# Patient Record
Sex: Female | Born: 1960 | Race: White | Hispanic: No | Marital: Married | Smoking: Never smoker
Health system: Southern US, Community
[De-identification: ages and names within clinical notes are randomized; demographics above are authoritative.]

## PROBLEM LIST (undated history)

## (undated) DIAGNOSIS — F418 Other specified anxiety disorders: Secondary | ICD-10-CM

## (undated) DIAGNOSIS — E785 Hyperlipidemia, unspecified: Secondary | ICD-10-CM

## (undated) HISTORY — PX: CHEST TUBE INSERTION: SHX231

## (undated) HISTORY — PX: ABDOMINAL HYSTERECTOMY: SHX81

---

## 2004-06-13 ENCOUNTER — Ambulatory Visit: Payer: Self-pay | Admitting: Obstetrics and Gynecology

## 2005-07-01 ENCOUNTER — Ambulatory Visit: Payer: Self-pay | Admitting: Obstetrics and Gynecology

## 2006-07-22 ENCOUNTER — Ambulatory Visit: Payer: Self-pay | Admitting: Obstetrics and Gynecology

## 2007-10-08 ENCOUNTER — Ambulatory Visit: Payer: Self-pay | Admitting: Obstetrics and Gynecology

## 2008-09-05 ENCOUNTER — Ambulatory Visit: Payer: Self-pay | Admitting: Obstetrics and Gynecology

## 2008-09-11 ENCOUNTER — Ambulatory Visit: Payer: Self-pay | Admitting: Obstetrics and Gynecology

## 2009-08-31 ENCOUNTER — Ambulatory Visit: Payer: Self-pay | Admitting: Obstetrics and Gynecology

## 2009-10-10 ENCOUNTER — Emergency Department: Payer: Self-pay | Admitting: Emergency Medicine

## 2010-09-12 ENCOUNTER — Ambulatory Visit: Payer: Self-pay | Admitting: Obstetrics and Gynecology

## 2011-09-15 ENCOUNTER — Ambulatory Visit: Payer: Self-pay | Admitting: Obstetrics and Gynecology

## 2012-10-04 ENCOUNTER — Ambulatory Visit: Payer: Self-pay | Admitting: Obstetrics and Gynecology

## 2013-10-18 ENCOUNTER — Ambulatory Visit: Payer: Self-pay | Admitting: Obstetrics and Gynecology

## 2014-08-08 ENCOUNTER — Encounter: Payer: Self-pay | Admitting: General Surgery

## 2014-08-23 ENCOUNTER — Ambulatory Visit: Payer: Self-pay | Admitting: General Surgery

## 2014-08-24 ENCOUNTER — Encounter: Payer: Self-pay | Admitting: *Deleted

## 2014-10-20 ENCOUNTER — Ambulatory Visit: Payer: Self-pay | Admitting: Obstetrics and Gynecology

## 2015-03-28 ENCOUNTER — Other Ambulatory Visit
Admission: RE | Admit: 2015-03-28 | Discharge: 2015-03-28 | Disposition: A | Discharge status: Home / Self Care | Payer: PRIVATE HEALTH INSURANCE | Source: Ambulatory Visit | Attending: Ophthalmology | Admitting: Ophthalmology

## 2015-03-28 DIAGNOSIS — H04301 Unspecified dacryocystitis of right lacrimal passage: Secondary | ICD-10-CM | POA: Insufficient documentation

## 2015-03-31 LAB — EYE CULTURE: Culture: NO GROWTH

## 2015-08-08 ENCOUNTER — Encounter: Payer: Self-pay | Admitting: Physician Assistant

## 2015-08-08 ENCOUNTER — Ambulatory Visit: Payer: Self-pay | Admitting: Physician Assistant

## 2015-08-08 VITALS — BP 119/60 | HR 95 | Temp 98.5°F

## 2015-08-08 DIAGNOSIS — J069 Acute upper respiratory infection, unspecified: Secondary | ICD-10-CM

## 2015-08-08 MED ORDER — AMOXICILLIN 875 MG PO TABS: 875.0000 mg | ORAL_TABLET | Freq: Two times a day (BID) | ORAL | Status: DC

## 2015-08-08 MED ORDER — BENZONATATE 200 MG PO CAPS: 200.0000 mg | ORAL_CAPSULE | Freq: Three times a day (TID) | ORAL | Status: DC | PRN

## 2015-08-08 NOTE — Progress Notes (Signed)
S: C/o runny nose and congestion for 2 days, no fever, +chills, no cp/sob, v/d; mucus was green this am but clear throughout the day, cough is sporadic,   O: PE: vitals wnl, nad,  perrl eomi, normocephalic, tms dull, nasal mucosa red and swollen, throat injected, neck supple no lymph, lungs c t a, cv rrr, neuro intact  A:  Acute uri   P: amoxil 875mg  bid x 10d, tessalon perls; drink fluids, continue regular meds , use otc meds of choice, return if not improving in 5 days, return earlier if worsening

## 2015-08-15 ENCOUNTER — Other Ambulatory Visit: Payer: Self-pay | Admitting: Obstetrics and Gynecology

## 2015-08-15 DIAGNOSIS — Z1231 Encounter for screening mammogram for malignant neoplasm of breast: Secondary | ICD-10-CM

## 2015-08-22 ENCOUNTER — Encounter: Payer: Self-pay | Admitting: Physician Assistant

## 2015-08-22 ENCOUNTER — Ambulatory Visit: Payer: Self-pay | Admitting: Physician Assistant

## 2015-08-22 VITALS — BP 100/70 | HR 87 | Temp 97.9°F

## 2015-08-22 DIAGNOSIS — J018 Other acute sinusitis: Secondary | ICD-10-CM

## 2015-08-22 MED ORDER — FLUCONAZOLE 150 MG PO TABS: 150.0000 mg | ORAL_TABLET | Freq: Once | ORAL | Status: DC

## 2015-08-22 MED ORDER — CEFDINIR 300 MG PO CAPS: 300.0000 mg | ORAL_CAPSULE | Freq: Two times a day (BID) | ORAL | Status: DC

## 2015-08-22 NOTE — Progress Notes (Signed)
S: continue cough and congestion, no fever/chills, no cp/sob, mucus is still green  O: vitals wnl, nad, tms dull, nasal mucosa red and swollen, throat wnl, neck supple no lymph, lungs c t a, cv rrr  A: continued sinusitis  P: omnicef, diflucan

## 2015-10-12 ENCOUNTER — Other Ambulatory Visit: Payer: Self-pay

## 2015-10-12 DIAGNOSIS — Z0000 Encounter for general adult medical examination without abnormal findings: Secondary | ICD-10-CM

## 2015-10-12 NOTE — Progress Notes (Signed)
Patient came in to have blood drawn per Dr. Dalbert Garnet at [facility].  Blood was drawn from the left arm without any incident.

## 2015-10-13 LAB — SPECIMEN STATUS

## 2015-10-17 LAB — COMPREHENSIVE METABOLIC PANEL
A/G RATIO: 2.3 (ref 1.1–2.5)
ALBUMIN: 4.4 g/dL (ref 3.5–5.5)
ALT: 15 IU/L (ref 0–32)
AST: 16 IU/L (ref 0–40)
Alkaline Phosphatase: 72 IU/L (ref 39–117)
BILIRUBIN TOTAL: 0.3 mg/dL (ref 0.0–1.2)
BUN / CREAT RATIO: 13 (ref 9–23)
BUN: 10 mg/dL (ref 6–24)
CALCIUM: 9.5 mg/dL (ref 8.7–10.2)
CHLORIDE: 103 mmol/L (ref 96–106)
CO2: 25 mmol/L (ref 18–29)
Creatinine, Ser: 0.78 mg/dL (ref 0.57–1.00)
GFR, EST AFRICAN AMERICAN: 101 mL/min/{1.73_m2} (ref >59)
GFR, EST NON AFRICAN AMERICAN: 88 mL/min/{1.73_m2} (ref >59)
Globulin, Total: 1.9 g/dL (ref 1.5–4.5)
Glucose: 87 mg/dL (ref 65–99)
POTASSIUM: 4.1 mmol/L (ref 3.5–5.2)
Sodium: 142 mmol/L (ref 134–144)
TOTAL PROTEIN: 6.3 g/dL (ref 6.0–8.5)

## 2015-10-17 LAB — LIPID PANEL
CHOLESTEROL TOTAL: 193 mg/dL (ref 100–199)
Chol/HDL Ratio: 2.9 ratio units (ref 0.0–4.4)
HDL: 67 mg/dL (ref >39)
LDL Calculated: 111 mg/dL — ABNORMAL HIGH (ref 0–99)
TRIGLYCERIDES: 73 mg/dL (ref 0–149)
VLDL Cholesterol Cal: 15 mg/dL (ref 5–40)

## 2015-10-17 LAB — SPECIMEN STATUS REPORT

## 2015-10-18 NOTE — Progress Notes (Signed)
Lab results were sent to Dr. Dalbert Garnet at [facility] and a copy was sent to the patient per her request.

## 2015-10-22 ENCOUNTER — Ambulatory Visit
Admission: RE | Admit: 2015-10-22 | Discharge: 2015-10-22 | Disposition: A | Discharge status: Home / Self Care | Payer: Managed Care, Other (non HMO) | Source: Ambulatory Visit | Attending: Obstetrics and Gynecology | Admitting: Obstetrics and Gynecology

## 2015-10-22 DIAGNOSIS — Z1231 Encounter for screening mammogram for malignant neoplasm of breast: Secondary | ICD-10-CM | POA: Diagnosis present

## 2015-11-29 ENCOUNTER — Other Ambulatory Visit: Payer: Self-pay | Admitting: Physician Assistant

## 2015-11-29 NOTE — Telephone Encounter (Signed)
Med refill approved 

## 2016-09-11 ENCOUNTER — Other Ambulatory Visit: Payer: Self-pay | Admitting: Obstetrics and Gynecology

## 2016-09-11 DIAGNOSIS — Z1231 Encounter for screening mammogram for malignant neoplasm of breast: Secondary | ICD-10-CM

## 2016-09-22 ENCOUNTER — Other Ambulatory Visit: Payer: Self-pay

## 2016-09-22 DIAGNOSIS — Z299 Encounter for prophylactic measures, unspecified: Secondary | ICD-10-CM

## 2016-09-22 NOTE — Progress Notes (Signed)
Patient came in to have blood drawn for testing per [facility]ethany [facility]

## 2016-09-23 LAB — CMP12+LP+TP+TSH+6AC+CBC/D/PLT
A/G RATIO: 1.9 (ref 1.2–2.2)
ALBUMIN: 4 g/dL (ref 3.5–5.5)
ALT: 15 IU/L (ref 0–32)
AST: 14 IU/L (ref 0–40)
Alkaline Phosphatase: 57 IU/L (ref 39–117)
BASOS ABS: 0 10*3/uL (ref 0.0–0.2)
BUN/Creatinine Ratio: 21 (ref 9–23)
BUN: 16 mg/dL (ref 6–24)
Basos: 0 %
CHOLESTEROL TOTAL: 195 mg/dL (ref 100–199)
CREATININE: 0.76 mg/dL (ref 0.57–1.00)
Calcium: 9 mg/dL (ref 8.7–10.2)
Chloride: 104 mmol/L (ref 96–106)
Chol/HDL Ratio: 2.7 ratio units (ref 0.0–4.4)
EOS (ABSOLUTE): 0.1 10*3/uL (ref 0.0–0.4)
Eos: 1 %
Estimated CHD Risk: <0.5 times avg. (ref 0.0–1.0)
FREE THYROXINE INDEX: 1.8 (ref 1.2–4.9)
GFR calc Af Amer: 104 mL/min/{1.73_m2} (ref >59)
GFR, EST NON AFRICAN AMERICAN: 90 mL/min/{1.73_m2} (ref >59)
GGT: 5 IU/L (ref 0–60)
GLOBULIN, TOTAL: 2.1 g/dL (ref 1.5–4.5)
GLUCOSE: 87 mg/dL (ref 65–99)
HDL: 73 mg/dL (ref >39)
Hematocrit: 37.3 % (ref 34.0–46.6)
Hemoglobin: 12.5 g/dL (ref 11.1–15.9)
IRON: 84 ug/dL (ref 27–159)
Immature Grans (Abs): 0 10*3/uL (ref 0.0–0.1)
Immature Granulocytes: 0 %
LDH: 172 IU/L (ref 119–226)
LDL Calculated: 107 mg/dL — ABNORMAL HIGH (ref 0–99)
LYMPHS ABS: 1.6 10*3/uL (ref 0.7–3.1)
Lymphs: 31 %
MCH: 32.9 pg (ref 26.6–33.0)
MCHC: 33.5 g/dL (ref 31.5–35.7)
MCV: 98 fL — ABNORMAL HIGH (ref 79–97)
MONOCYTES: 6 %
Monocytes Absolute: 0.3 10*3/uL (ref 0.1–0.9)
NEUTROS PCT: 62 %
Neutrophils Absolute: 3.1 10*3/uL (ref 1.4–7.0)
PHOSPHORUS: 3.7 mg/dL (ref 2.5–4.5)
PLATELETS: 222 10*3/uL (ref 150–379)
Potassium: 4.5 mmol/L (ref 3.5–5.2)
RBC: 3.8 x10E6/uL (ref 3.77–5.28)
RDW: 12.9 % (ref 12.3–15.4)
Sodium: 143 mmol/L (ref 134–144)
T3 Uptake Ratio: 21 % — ABNORMAL LOW (ref 24–39)
T4 TOTAL: 8.6 ug/dL (ref 4.5–12.0)
TOTAL PROTEIN: 6.1 g/dL (ref 6.0–8.5)
TSH: 1.84 u[IU]/mL (ref 0.450–4.500)
Triglycerides: 75 mg/dL (ref 0–149)
URIC ACID: 3.4 mg/dL (ref 2.5–7.1)
VLDL Cholesterol Cal: 15 mg/dL (ref 5–40)
WBC: 5.1 10*3/uL (ref 3.4–10.8)

## 2016-10-28 ENCOUNTER — Ambulatory Visit: Payer: PRIVATE HEALTH INSURANCE

## 2016-12-09 ENCOUNTER — Ambulatory Visit
Admission: RE | Admit: 2016-12-09 | Discharge: 2016-12-09 | Disposition: A | Discharge status: Home / Self Care | Payer: Managed Care, Other (non HMO) | Source: Ambulatory Visit | Attending: Obstetrics and Gynecology | Admitting: Obstetrics and Gynecology

## 2016-12-09 DIAGNOSIS — Z1231 Encounter for screening mammogram for malignant neoplasm of breast: Secondary | ICD-10-CM | POA: Diagnosis not present

## 2016-12-26 ENCOUNTER — Other Ambulatory Visit: Payer: Self-pay | Admitting: Physician Assistant

## 2016-12-29 NOTE — Telephone Encounter (Signed)
Med refill for flonase approved 

## 2017-07-06 ENCOUNTER — Encounter: Payer: Self-pay | Admitting: Emergency Medicine

## 2017-07-06 ENCOUNTER — Other Ambulatory Visit: Payer: Self-pay

## 2017-07-06 ENCOUNTER — Emergency Department
Admission: EM | Admit: 2017-07-06 | Discharge: 2017-07-06 | Disposition: A | Discharge status: Home / Self Care | Payer: Managed Care, Other (non HMO) | Attending: Student in an Organized Health Care Education/Training Program | Admitting: Student in an Organized Health Care Education/Training Program

## 2017-07-06 DIAGNOSIS — Z79899 Other long term (current) drug therapy: Secondary | ICD-10-CM | POA: Insufficient documentation

## 2017-07-06 DIAGNOSIS — M62830 Muscle spasm of back: Secondary | ICD-10-CM | POA: Insufficient documentation

## 2017-07-06 DIAGNOSIS — M549 Dorsalgia, unspecified: Secondary | ICD-10-CM | POA: Diagnosis present

## 2017-07-06 MED ORDER — NAPROXEN 500 MG PO TABS: 500.0000 mg | ORAL_TABLET | Freq: Two times a day (BID) | ORAL | 0 refills | Status: DC

## 2017-07-06 MED ORDER — METHOCARBAMOL 500 MG PO TABS
1000.0000 mg | ORAL_TABLET | Freq: Once | ORAL | Status: AC
  Administered 2017-07-06: 1000 mg via ORAL
  Filled 2017-07-06: qty 2

## 2017-07-06 MED ORDER — METHOCARBAMOL 500 MG PO TABS: 500.0000 mg | ORAL_TABLET | Freq: Four times a day (QID) | ORAL | 0 refills | Status: DC

## 2017-07-06 MED ORDER — CYCLOBENZAPRINE HCL 10 MG PO TABS: 10.0000 mg | ORAL_TABLET | Freq: Once | ORAL | Status: DC

## 2017-07-06 MED ORDER — KETOROLAC TROMETHAMINE 30 MG/ML IJ SOLN
30.0000 mg | Freq: Once | INTRAMUSCULAR | Status: AC
  Administered 2017-07-06: 30 mg via INTRAMUSCULAR
  Filled 2017-07-06: qty 1

## 2017-07-06 MED ORDER — OXYCODONE-ACETAMINOPHEN 5-325 MG PO TABS: 1.0000 | ORAL_TABLET | Freq: Four times a day (QID) | ORAL | 0 refills | Status: DC | PRN

## 2017-07-06 MED ORDER — TRAMADOL HCL 50 MG PO TABS: 50.0000 mg | ORAL_TABLET | Freq: Once | ORAL | Status: DC

## 2017-07-06 MED ORDER — OXYCODONE-ACETAMINOPHEN 5-325 MG PO TABS
1.0000 | ORAL_TABLET | Freq: Once | ORAL | Status: AC
  Administered 2017-07-06: 1 via ORAL
  Filled 2017-07-06: qty 1

## 2017-07-06 NOTE — ED Provider Notes (Signed)
[facility] Emergency Department Provider Note   ____________________________________________   First MD Initiated Contact with Patient 07/06/17 1227     (approximate)  I have reviewed the triage vital signs and the nursing notes.   HISTORY  Chief Complaint Back Pain   HPI Lisa Mccullough is a 56 y.o. female is here complaining of back pain with spasms. Patient states he has history of back pain in the past but no surgeries. She states that today she was turning at work when she felt "a catch". Since that time she has continued to have muscle spasms and has been very uncomfortable. A coworker is with patient who had to drive her here. Patient has not taken any over-the-counter medication prior to arrival. She denies any paresthesias, incontinence of bowel or bladder. Currently she rates her pain as 10 over 10.   History reviewed. No pertinent past medical history.  There are no active problems to display for this patient.   Past Surgical History:  Procedure Laterality Date  . ABDOMINAL HYSTERECTOMY      Prior to Admission medications   Medication Sig Start Date End Date Taking? Authorizing Provider  buPROPion (BUDEPRION XL) 300 MG 24 hr tablet Take by mouth. 08/08/14   [provider]  Cholecalciferol (D 1000) 1000 UNITS capsule Take by mouth.    [provider]  estrogens, conjugated, (PREMARIN) 0.625 MG tablet TAKE 1 TABLET (0.625 MG TOTAL) BY MOUTH ONCE DAILY. 04/02/15   [provider]  fluconazole (DIFLUCAN) 150 MG tablet Take 1 tablet (150 mg total) by mouth once. 08/22/15   Sherrie MustacheFisher, Roselyn BeringSusan W, PA-C  fluticasone [facility](FLONASE) 50 MCG/ACT nasal spray [facility] NO[facility]IL ONCE DAILY 12/29/16   Sherrie MustacheFisher, Roselyn BeringSusan W, PA-C  methocarbamol (ROBAXIN) 500 MG tablet Take 1 tablet (500 mg total) 4 (four) times daily by mouth. 07/06/17   Tommi RumpsSummers, Rhonda L, PA-C  naproxen (NAPROSYN) 500 MG tablet Take 1 tablet (500 mg total) 2 (two) times  daily with a meal by mouth. 07/06/17   Tommi RumpsSummers, Rhonda L, PA-C  oxyCODONE-acetaminophen (PERCOCET) 5-325 MG tablet Take 1 tablet every 6 (six) hours as needed by mouth for severe pain. 07/06/17   Tommi RumpsSummers, Rhonda L, PA-C    Allergies Codeine sulfate  Family History  Problem Relation Age of Onset  . Breast cancer Neg Hx     Social History Social History   Tobacco Use  . Smoking status: Never Smoker  . Smokeless tobacco: Never Used  Substance Use Topics  . Alcohol use: No    Alcohol/week: 0.0 oz    Frequency: Never  . Drug use: No    Review of Systems Constitutional: No fever/chills Cardiovascular: Denies chest pain. Respiratory: Denies shortness of breath. Gastrointestinal: No abdominal pain.  No nausea, no vomiting. Genitourinary: Negative for dysuria. Musculoskeletal: positive for back pain. Skin: Negative for rash. Neurological: Negative for headaches, focal weakness or numbness. ____________________________________________   PHYSICAL EXAM:  VITAL SIGNS: ED Triage Vitals  Enc Vitals Group     BP 07/06/17 1214 (!) 143/61     Pulse Rate 07/06/17 1214 81     Resp 07/06/17 1214 18     Temp 07/06/17 1214 98.6 F (37 C)     Temp Source 07/06/17 1214 Oral     SpO2 07/06/17 1214 100 %     Weight 07/06/17 1215 155 lb (70.3 kg)     Height 07/06/17 1215 5\' 2"  (1.575 m)     Head Circumference --  Peak Flow --      Pain Score 07/06/17 1214 10     Pain Loc --      Pain Edu? --      Excl. in GC? --     Constitutional: Alert and oriented. Well appearing and in no acute distress. Eyes: Conjunctivae are normal.  Head: Atraumatic. Nose: No congestion/rhinnorhea. Neck: No stridor.   Cardiovascular: Normal rate, regular rhythm. Grossly normal heart sounds.  Good peripheral circulation. Respiratory: Normal respiratory effort.  No retractions. Lungs CTAB. Gastrointestinal: Soft and nontender. No distention.  Musculoskeletal: examination back there is no gross  deformity however there is marked tenderness on palpation of the thoracic and lumbar spine and paravertebral muscles bilaterally. Patient is guarding. Reflexes are equal bilaterally. Straight leg raises are difficult secondary to patient's pain. Neurologic:  Normal speech and language. No gross focal neurologic deficits are appreciated.  Skin:  Skin is warm, dry and intact. No ecchymosis, abrasions or erythema was noted. Psychiatric: Mood and affect are normal. Speech and behavior are normal.  ____________________________________________   LABS (all labs ordered are listed, but only abnormal results are displayed)  Labs Reviewed - No data to display RADIOLOGY  No results found.  ____________________________________________   PROCEDURES  Procedure(s) performed: None  Procedures  Critical Care performed: No  ____________________________________________   INITIAL IMPRESSION / ASSESSMENT AND PLAN / ED COURSE Patient was given Toradol 30 mg IM along with Robaxin and Percocet. Patient did get relief with this and was able to move without severe muscle spasms. Patient's coworker is to drive her home. Patient was encouraged to use ice or heat to her back as needed while she arrives at her home. She is aware that she cannot drive while taking this medication. Patient was discharged on Percocet 5 mg, Robaxin, and naproxen. She is to follow-up with her PCP if any continued problems. Patient was also given a work note for the next 2 days.  ____________________________________________   FINAL CLINICAL IMPRESSION(S) / ED DIAGNOSES  Final diagnoses:  Muscle spasm of back     ED Discharge Orders        Ordered    oxyCODONE-acetaminophen (PERCOCET) 5-325 MG tablet  Every 6 hours PRN     07/06/17 1405    methocarbamol (ROBAXIN) 500 MG tablet  4 times daily     07/06/17 1405    naproxen (NAPROSYN) 500 MG tablet  2 times daily with meals     07/06/17 1405       Note:  This document  was prepared using Dragon voice recognition software and may include unintentional dictation errors.    Tommi RumpsSummers, Rhonda L, PA-C 07/06/17 1529    Willy Eddyobinson, Patrick, MD 07/06/17 [phone]

## 2017-07-06 NOTE — Discharge Instructions (Signed)
Follow up with your primary care provider if any continued problems. Moist heat or ice to your back as needed for comfort. Continue taking medication only as directed. Robaxin 500 mg 4 times a day as needed for muscle spasms, naproxen 500 mg twice a day with food for inflammation. Percocet one every 6 hours as needed for severe pain. Do not drive all taking Robaxin or Percocet as his medications can cause drowsiness.

## 2017-07-06 NOTE — ED Notes (Signed)
Pt ambulatory to wheel chair upon discharge. Pt husband drove her home d/t medications administered during stay in ED. Verbalized understanding of discharge instructions, follow-up care and prescriptions. VSS. A&O x4. Skin warm and dry.

## 2017-07-06 NOTE — ED Triage Notes (Signed)
States back spasms and pain, states hx of back pain and was at work today when she "tweeked" something

## 2017-09-17 ENCOUNTER — Other Ambulatory Visit: Payer: Self-pay | Admitting: Obstetrics and Gynecology

## 2017-09-17 DIAGNOSIS — Z1231 Encounter for screening mammogram for malignant neoplasm of breast: Secondary | ICD-10-CM

## 2017-10-22 ENCOUNTER — Ambulatory Visit: Payer: Self-pay

## 2017-12-10 ENCOUNTER — Ambulatory Visit
Admission: RE | Admit: 2017-12-10 | Discharge: 2017-12-10 | Disposition: A | Discharge status: Home / Self Care | Payer: Managed Care, Other (non HMO) | Source: Ambulatory Visit | Attending: Obstetrics and Gynecology | Admitting: Obstetrics and Gynecology

## 2017-12-10 DIAGNOSIS — Z1231 Encounter for screening mammogram for malignant neoplasm of breast: Secondary | ICD-10-CM | POA: Insufficient documentation

## 2017-12-14 ENCOUNTER — Other Ambulatory Visit: Payer: Self-pay | Admitting: Family Medicine

## 2017-12-14 DIAGNOSIS — Z20828 Contact with and (suspected) exposure to other viral communicable diseases: Secondary | ICD-10-CM

## 2017-12-14 MED ORDER — OSELTAMIVIR PHOSPHATE 75 MG PO CAPS: 75.0000 mg | ORAL_CAPSULE | Freq: Every day | ORAL | 0 refills | Status: AC

## 2017-12-14 NOTE — Progress Notes (Signed)
Patient called to say that multiple household members were just diagnosed with the flu and was requesting a prescription for Tamiflu. Patient is asymptomatic. Confirmed that patient is not allergic to this and that she is only currently taking premarin, wellbutrin, and zyrtec.  Advised patient to be counseled in full by the pharmacist regarding safety/adverse/side/warning of this medication.

## 2018-01-25 ENCOUNTER — Ambulatory Visit: Payer: Self-pay | Admitting: Family Medicine

## 2018-01-25 VITALS — BP 99/58 | Ht 63.0 in | Wt 177.0 lb

## 2018-01-25 DIAGNOSIS — Z008 Encounter for other general examination: Secondary | ICD-10-CM

## 2018-01-25 DIAGNOSIS — Z0189 Encounter for other specified special examinations: Principal | ICD-10-CM

## 2018-01-25 NOTE — Progress Notes (Signed)
Subjective: Annual biometrics screening labs Patient presents for her annual biometric screening labs only.  Patient has had her annual physical exam with Dr. Dalbert GarnetBeasley.  Patient reports that her blood pressure is usually in the 90s over 50s.  Patient reports this is normal for her and she is asymptomatic.  Denies any symptoms or concerns today.  Assessment Annual biometrics screening labs   Plan  Lipid panel and fasting blood sugar pending. Encouraged routine visits with primary care provider.

## 2018-01-26 LAB — LIPID PANEL
CHOL/HDL RATIO: 3.2 ratio (ref 0.0–4.4)
Cholesterol, Total: 205 mg/dL — ABNORMAL HIGH (ref 100–199)
HDL: 65 mg/dL (ref >39)
LDL Calculated: 121 mg/dL — ABNORMAL HIGH (ref 0–99)
TRIGLYCERIDES: 95 mg/dL (ref 0–149)
VLDL Cholesterol Cal: 19 mg/dL (ref 5–40)

## 2018-01-26 LAB — GLUCOSE, RANDOM: GLUCOSE: 92 mg/dL (ref 65–99)

## 2018-01-26 NOTE — Progress Notes (Signed)
Carollee HerterShannon, Will you call the patient and inform them that their lipid panel and fasting blood sugar came back?  Everything is normal, with the exception of her total cholesterol and LDL cholesterol.  The total cholesterol is elevated at 205, normal values are between 100 and 199.  Last year her total cholesterol was 195.  The LDL cholesterol ("bad cholesterol") is elevated at 121, normal values are below 99.  Last year her LDL cholesterol was 107.  Please advise the patient to follow-up with their primary care provider regarding these results.

## 2018-09-03 ENCOUNTER — Ambulatory Visit: Payer: Managed Care, Other (non HMO) | Admitting: Podiatry

## 2018-09-14 ENCOUNTER — Ambulatory Visit (INDEPENDENT_AMBULATORY_CARE_PROVIDER_SITE_OTHER): Payer: Managed Care, Other (non HMO)

## 2018-09-14 ENCOUNTER — Encounter: Payer: Self-pay | Admitting: Podiatry

## 2018-09-14 ENCOUNTER — Ambulatory Visit: Payer: Managed Care, Other (non HMO) | Admitting: Podiatry

## 2018-09-14 VITALS — BP 100/64 | HR 76

## 2018-09-14 DIAGNOSIS — M779 Enthesopathy, unspecified: Secondary | ICD-10-CM | POA: Diagnosis not present

## 2018-09-14 DIAGNOSIS — S93324A Dislocation of tarsometatarsal joint of right foot, initial encounter: Secondary | ICD-10-CM | POA: Diagnosis not present

## 2018-09-14 MED ORDER — DICLOFENAC SODIUM 75 MG PO TBEC: 75.0000 mg | DELAYED_RELEASE_TABLET | Freq: Two times a day (BID) | ORAL | 1 refills | Status: DC

## 2018-09-15 ENCOUNTER — Telehealth: Payer: Self-pay | Admitting: *Deleted

## 2018-09-25 ENCOUNTER — Ambulatory Visit
Admission: RE | Admit: 2018-09-25 | Discharge: 2018-09-25 | Disposition: A | Discharge status: Home / Self Care | Payer: Managed Care, Other (non HMO) | Source: Ambulatory Visit | Attending: Podiatry | Admitting: Podiatry

## 2018-09-25 DIAGNOSIS — S93324A Dislocation of tarsometatarsal joint of right foot, initial encounter: Secondary | ICD-10-CM | POA: Insufficient documentation

## 2018-09-26 NOTE — Progress Notes (Signed)
   HPI: 58 year old female presents the office today for evaluation regarding right foot pain.  Patient has significant amount of pain when walking on her right foot.  She also notices it rolls at times.  When stepping she also hears an audible pop.  Patient states that she sustained a slip and fall injury in November and landed with her entire body weight on top of her foot.  The foot fell back behind her when she fell on top of it.  Patient has had chronic pain to the right foot ever since the injury.  This is been ongoing for the past 3-4 months now.  Patient is constant nagging and throbbing pain despite conservative modalities.  She presents for treatment and evaluation  History reviewed. No pertinent past medical history.   Physical Exam: General: The patient is alert and oriented x3 in no acute distress.  Dermatology: Skin is warm, dry and supple bilateral lower extremities. Negative for open lesions or macerations.  Vascular: Palpable pedal pulses bilaterally. No edema or erythema noted. Capillary refill within normal limits.  Neurological: Epicritic and protective threshold grossly intact bilaterally.   Musculoskeletal Exam: Range of motion within normal limits to all pedal and ankle joints bilateral. Muscle strength 5/5 in all groups bilateral.  Significant amount of pain on palpation noted to the Lisfranc joint between the medial cuneiform and second metatarsal base.  Radiographic Exam:  Normal osseous mineralization. Joint spaces preserved. No fracture/dislocation/boney destruction.  There does appear to be some joint space widening of the Lisfranc joint between the medial cuneiform and second metatarsal.  This correlates to the significant amount of pain on palpation to the Lisfranc joint.  No osseous fracture identified however.  Assessment: 1.  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 Patient evaluated. X-Rays reviewed.  2.  Prescription  for diclofenac 2 times daily 3.  Today we placed the patient in an immobilization cam boot.  Weightbearing as tolerated to the right foot 4.  Today we are going to order an MRI right foot to rule out Lisfranc ligament rupture.  Patient may require ORIF with screw fixation to the Lisfranc ligament if there is findings of a Lisfranc injury. 5.  Return to clinic in 3 weeks to review MRI results      Felecia ShellingBrent M. Pennelope Basque, DPM [facility]  Dr. Felecia ShellingBrent M. Arsen Mangione, DPM    [address]                                        [place], [place] [address]                Office [phone]  Fax [phone]

## 2018-09-27 ENCOUNTER — Other Ambulatory Visit: Payer: Self-pay

## 2018-09-27 ENCOUNTER — Telehealth: Payer: Self-pay

## 2018-09-27 MED ORDER — NAPROXEN 500 MG PO TABS: 500.0000 mg | ORAL_TABLET | Freq: Two times a day (BID) | ORAL | 0 refills | Status: DC

## 2018-09-27 NOTE — Telephone Encounter (Signed)
Meloxicam or Naprosyn 500mg  BID  Thanks, Dr. Logan BoresEvans

## 2018-09-27 NOTE — Telephone Encounter (Signed)
Dr. Norlene Campbell called stating that medication Dr. Logan Bores prescribed her at her last appointment "Diclofenac Sodium" has given her headaches and would like to know if he can prescribe something different. Please call her to let her know once medication has been changed and sent over to pharmacy.   Please advise

## 2018-09-27 NOTE — Progress Notes (Signed)
Rx Naproxen 500mg  x2 daily sent into pharmacy on file

## 2018-09-27 NOTE — Telephone Encounter (Signed)
Completed - Called pt. Spoke and Talked to pt about medication problem with diclofenac sodium; pt stated, "medication is giving me headaches, wanted to try other pain medication";  Informed pt of Dr. Logan Bores suggesstion of Naproxen x2 daily; comfirmed pharmacy and sent

## 2018-10-08 ENCOUNTER — Ambulatory Visit: Payer: Managed Care, Other (non HMO) | Admitting: Podiatry

## 2018-10-08 ENCOUNTER — Encounter: Payer: Self-pay | Admitting: Podiatry

## 2018-10-08 DIAGNOSIS — M21611 Bunion of right foot: Secondary | ICD-10-CM | POA: Diagnosis not present

## 2018-10-08 DIAGNOSIS — M76821 Posterior tibial tendinitis, right leg: Secondary | ICD-10-CM | POA: Diagnosis not present

## 2018-10-08 MED ORDER — IBUPROFEN 800 MG PO TABS: 800.0000 mg | ORAL_TABLET | Freq: Three times a day (TID) | ORAL | 1 refills | Status: DC | PRN

## 2018-10-08 MED ORDER — METHYLPREDNISOLONE 4 MG PO TBPK: ORAL_TABLET | ORAL | 0 refills | Status: DC

## 2018-10-11 NOTE — Progress Notes (Signed)
    HPI: 58 year old female presenting today for follow up evaluation of right foot pain. She reports the pain has improved but not completely resolved. She has been using the CAM boot which helps alleviate the symptoms. She denies worsening factors. She is here to review her MRI results. Patient is here for further evaluation and treatment.  No past medical history on file.    Physical Exam: General: The patient is alert and oriented x3 in no acute distress.  Dermatology: Skin is warm, dry and supple bilateral lower extremities. Negative for open lesions or macerations.  Vascular: Palpable pedal pulses bilaterally. No edema or erythema noted. Capillary refill within normal limits.  Neurological: Epicritic and protective threshold grossly intact bilaterally.   Musculoskeletal Exam: Pain on palpation noted to the insertion of the posterior tibial tendon of the right foot. Clinical evidence of bunion deformity noted to the respective foot. There is moderate pain on palpation range of motion of the first MPJ. Lateral deviation of the hallux noted consistent with hallux abductovalgus. Range of motion within normal limits. Muscle strength 5/5 in all muscle groups bilateral lower extremities.  MRI Impression:  1. Bifid medial sesamoid of the first digit with very low-grade edema suggesting low-grade sesamoiditis. 2. Postoperative findings of the fifth metatarsal. Unfused secondary ossification center versus remote nondisplaced but nonunited avulsion fracture of the base of fifth metatarsal. 3. No significant subcutaneous edema is identified. No extensor tendon abnormality of the great toe is noted. 4. Mild degenerative spurring along the Lisfranc joint, but the Lisfranc ligament is intact and the Lisfranc joint appears otherwise unremarkable. 5. There is some loss of the normal arch due to large of the foot suggested on the sagittal images, can not exclude pes planus although today's images were not  obtained under weight-bearing.  Assessment: 1. 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 Insertional posterior tibial tendinitis right 3. HAV w/ bunion deformity right   Plan of Care:  1. Patient was evaluated. MRI was reviewed today. 2. Injection of 0.5 mL Celestone Soluspan injected into the insertion of the right posterior tibial tendon sheath.  3. Prescription for Medrol Dose Pak provided to patient. 4. Prescription for Motrin 800 mg provided to patient.  5. Discontinue using CAM boot. Recommended good shoe gear.  6. Bunion pad dispensed. Surgical vs conservative treatments were discussed. We will continue conservative treatment at this time.  7. Return to clinic in 4 weeks.    Felecia Shelling, DPM [facility]  Dr. Felecia Shelling, DPM    [address]                                        [place], [place] [address]                Office [phone]  Fax [phone]

## 2018-10-29 ENCOUNTER — Ambulatory Visit: Payer: Managed Care, Other (non HMO) | Admitting: Podiatry

## 2019-01-24 ENCOUNTER — Other Ambulatory Visit: Payer: Self-pay | Admitting: Obstetrics and Gynecology

## 2019-01-24 DIAGNOSIS — Z1231 Encounter for screening mammogram for malignant neoplasm of breast: Secondary | ICD-10-CM

## 2019-03-07 ENCOUNTER — Other Ambulatory Visit: Payer: Self-pay

## 2019-03-07 ENCOUNTER — Ambulatory Visit
Admission: RE | Admit: 2019-03-07 | Discharge: 2019-03-07 | Disposition: A | Discharge status: Home / Self Care | Payer: Managed Care, Other (non HMO) | Source: Ambulatory Visit | Attending: Obstetrics and Gynecology | Admitting: Obstetrics and Gynecology

## 2019-03-07 DIAGNOSIS — Z1231 Encounter for screening mammogram for malignant neoplasm of breast: Secondary | ICD-10-CM | POA: Diagnosis present

## 2019-05-10 ENCOUNTER — Other Ambulatory Visit: Payer: Self-pay

## 2019-05-10 MED ORDER — IBUPROFEN 800 MG PO TABS: 800.0000 mg | ORAL_TABLET | Freq: Three times a day (TID) | ORAL | 3 refills | Status: DC | PRN

## 2019-05-10 NOTE — Telephone Encounter (Signed)
Pharmacy refill request for Ibuprofen 800mg   Per Dr. Evans verbal order, ok to refill.   Script has been sent to pharmacy 

## 2020-01-12 ENCOUNTER — Other Ambulatory Visit: Payer: Self-pay | Admitting: Internal Medicine

## 2020-01-12 DIAGNOSIS — Z1231 Encounter for screening mammogram for malignant neoplasm of breast: Secondary | ICD-10-CM

## 2020-02-02 ENCOUNTER — Other Ambulatory Visit: Payer: Self-pay | Admitting: Obstetrics and Gynecology

## 2020-02-02 DIAGNOSIS — Z1231 Encounter for screening mammogram for malignant neoplasm of breast: Secondary | ICD-10-CM

## 2020-03-08 ENCOUNTER — Ambulatory Visit
Admission: RE | Admit: 2020-03-08 | Discharge: 2020-03-08 | Disposition: A | Discharge status: Home / Self Care | Payer: Managed Care, Other (non HMO) | Source: Ambulatory Visit | Attending: Obstetrics and Gynecology | Admitting: Obstetrics and Gynecology

## 2020-03-08 DIAGNOSIS — Z1231 Encounter for screening mammogram for malignant neoplasm of breast: Secondary | ICD-10-CM | POA: Diagnosis present

## 2021-03-20 ENCOUNTER — Other Ambulatory Visit: Payer: Self-pay | Admitting: Internal Medicine

## 2021-03-20 DIAGNOSIS — Z1231 Encounter for screening mammogram for malignant neoplasm of breast: Secondary | ICD-10-CM

## 2021-03-27 ENCOUNTER — Other Ambulatory Visit: Payer: Self-pay

## 2021-03-27 ENCOUNTER — Ambulatory Visit
Admission: RE | Admit: 2021-03-27 | Discharge: 2021-03-27 | Disposition: A | Discharge status: Home / Self Care | Payer: Managed Care, Other (non HMO) | Source: Ambulatory Visit | Attending: Internal Medicine | Admitting: Internal Medicine

## 2021-03-27 DIAGNOSIS — Z1231 Encounter for screening mammogram for malignant neoplasm of breast: Secondary | ICD-10-CM | POA: Insufficient documentation

## 2021-12-23 IMAGING — MG MM DIGITAL SCREENING BILAT W/ TOMO AND CAD
8 series · 8 of 24 positions shown · non-contrast
Comparison: Previous exam(s).

CLINICAL DATA: Screening.

EXAM:
DIGITAL SCREENING BILATERAL MAMMOGRAM WITH TOMOSYNTHESIS AND CAD
TECHNIQUE: Bilateral screening digital craniocaudal and mediolateral oblique
mammograms were obtained. Bilateral screening digital breast
tomosynthesis was performed. The images were evaluated with
computer-aided detection.

[R CC synth-2D]
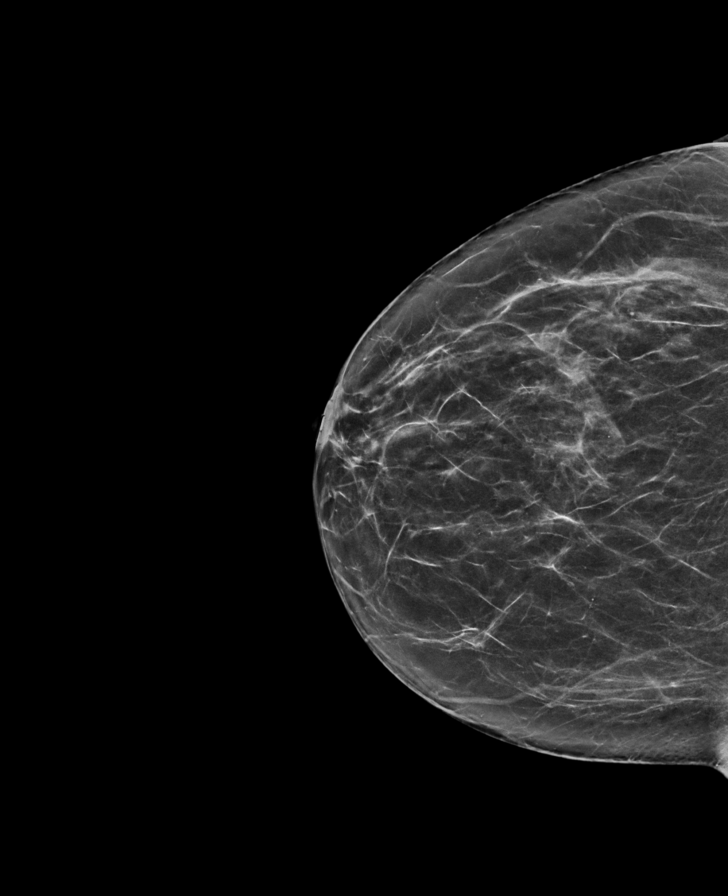

[L MLO synth-2D]
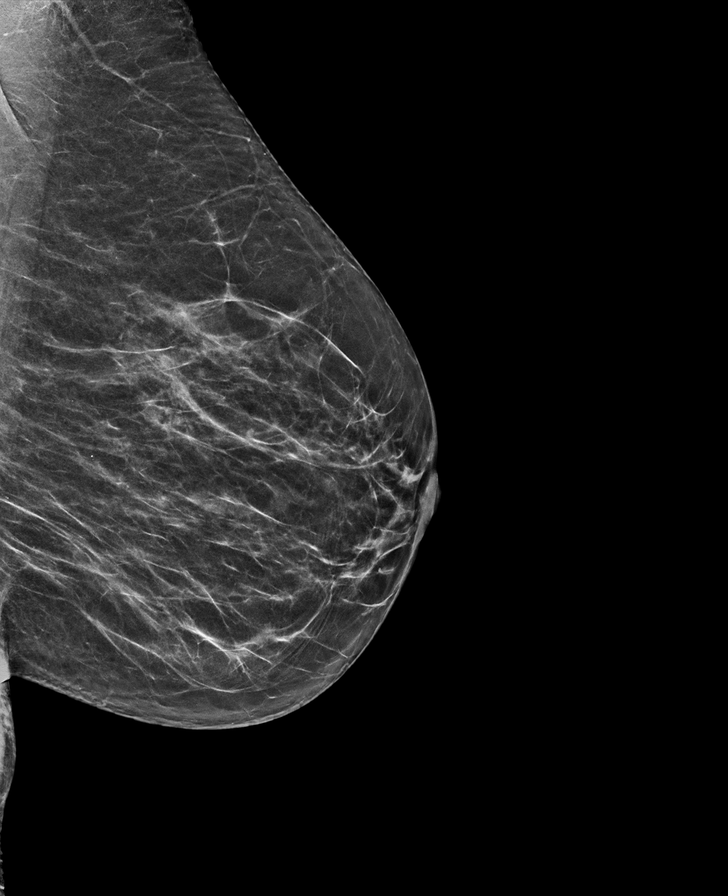

[R MLO synth-2D]
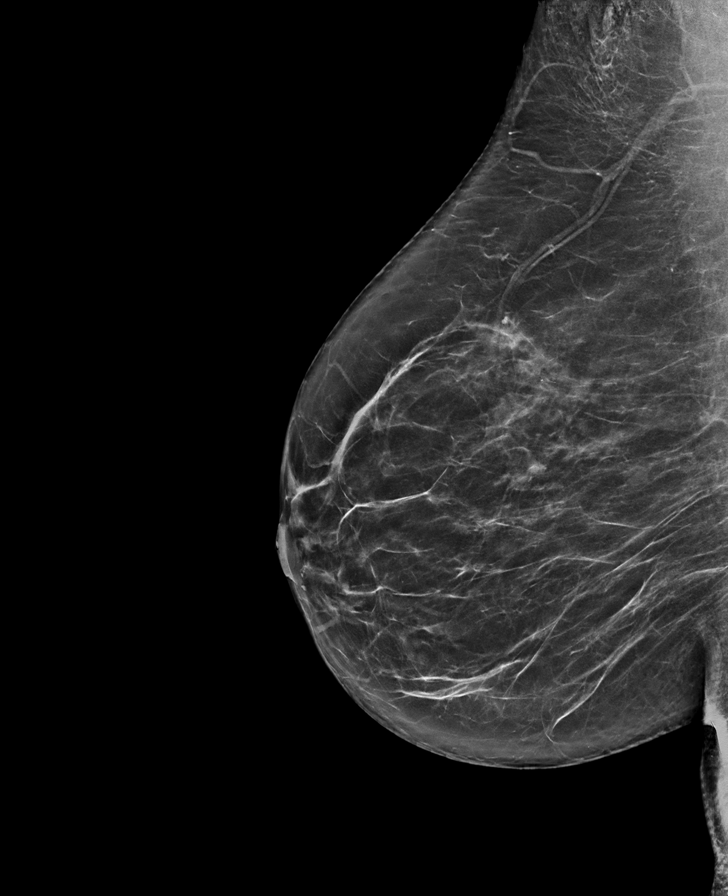

[L CC synth-2D]
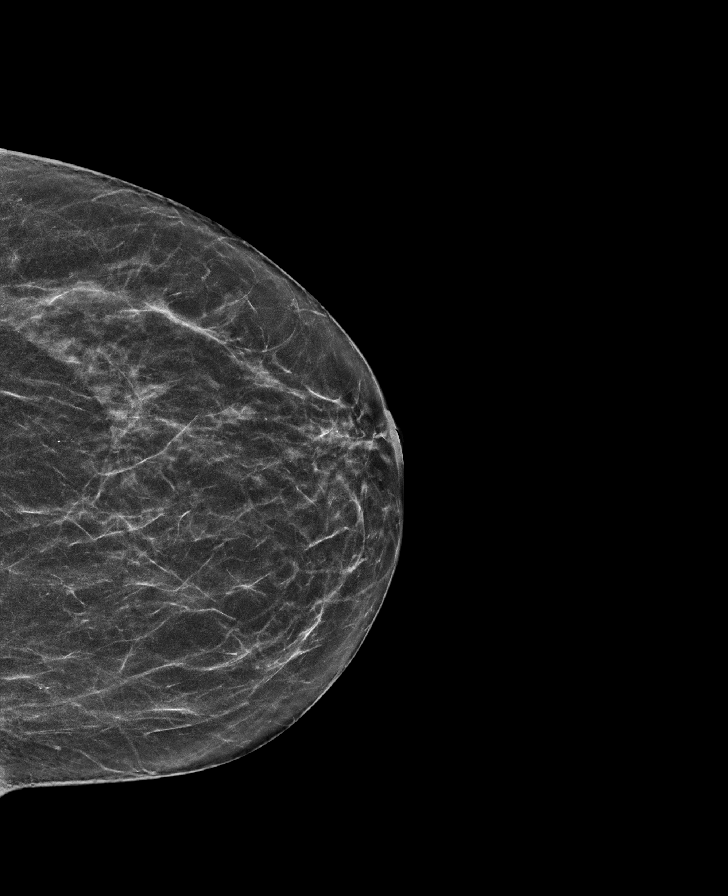

[R CC tomo · tomo slice 35/70.0]
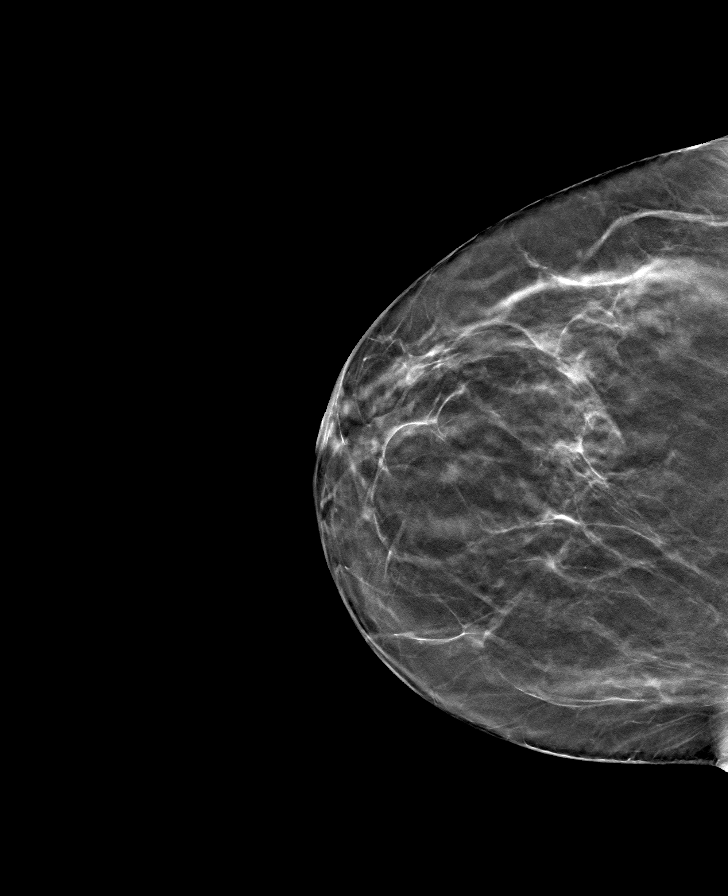

[L MLO tomo · tomo slice 31/60.0]
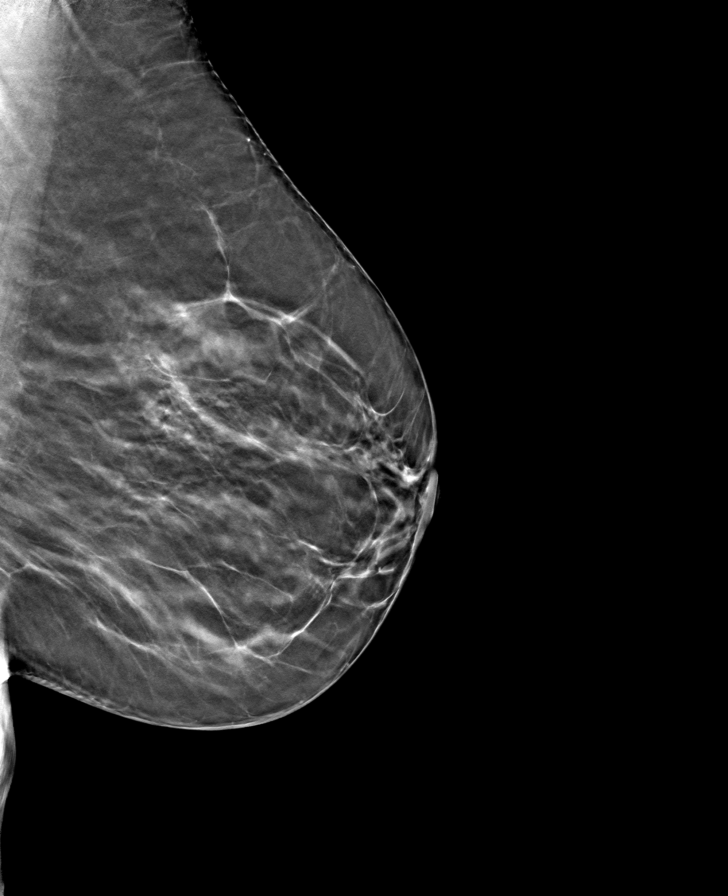

[L CC tomo · tomo slice 30/59.0]
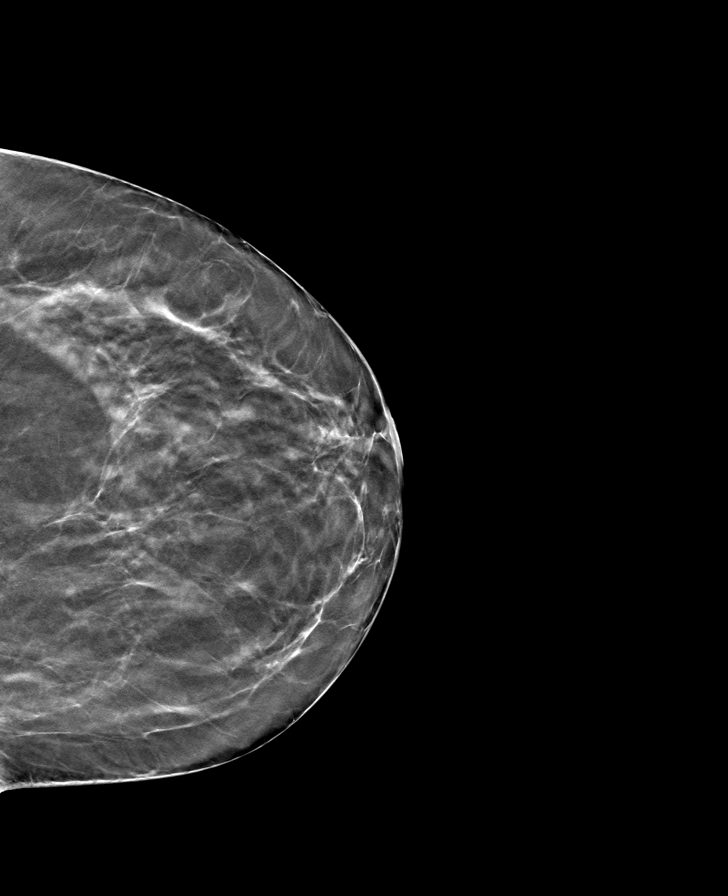

[R MLO tomo · tomo slice 36/71.0]
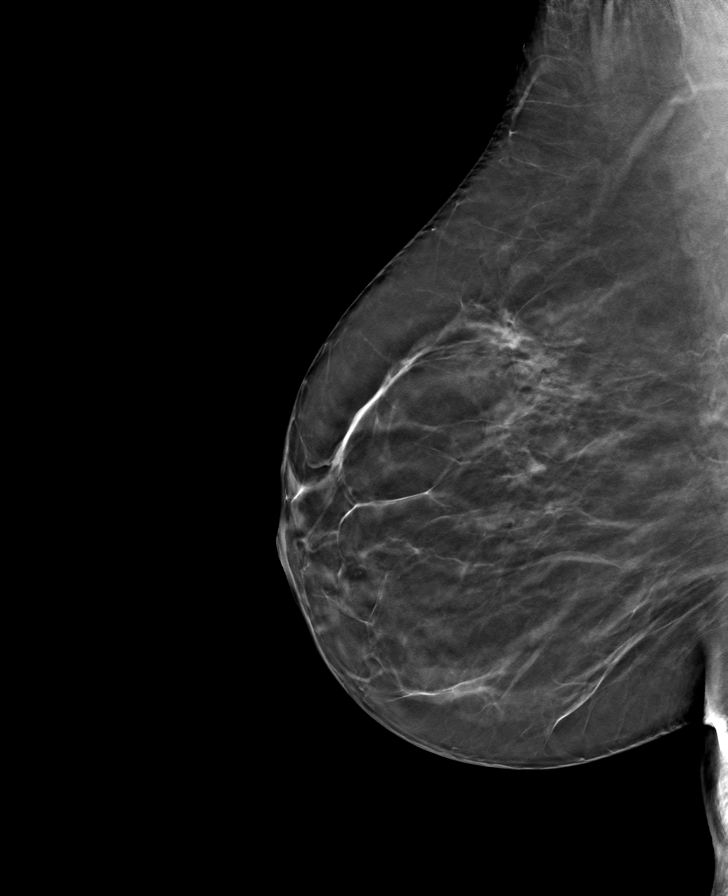

[8 of 24 positions shown; findings below may reference images not displayed]

ACR Breast Density Category b: There are scattered areas of
fibroglandular density.
FINDINGS: There are no findings suspicious for malignancy.
IMPRESSION: No mammographic evidence of malignancy. A result letter of this
screening mammogram will be mailed directly to the patient.

RECOMMENDATION:
Screening mammogram in one year. (Code:51-O-LD2)

BI-RADS CATEGORY  1: Negative.

## 2022-04-16 ENCOUNTER — Other Ambulatory Visit: Payer: Self-pay | Admitting: Internal Medicine

## 2022-04-16 DIAGNOSIS — Z1231 Encounter for screening mammogram for malignant neoplasm of breast: Secondary | ICD-10-CM

## 2022-05-20 ENCOUNTER — Ambulatory Visit
Admission: RE | Admit: 2022-05-20 | Discharge: 2022-05-20 | Disposition: A | Discharge status: Home / Self Care | Payer: Managed Care, Other (non HMO) | Source: Ambulatory Visit | Attending: Internal Medicine | Admitting: Internal Medicine

## 2022-05-20 DIAGNOSIS — Z1231 Encounter for screening mammogram for malignant neoplasm of breast: Secondary | ICD-10-CM | POA: Diagnosis not present

## 2023-04-21 NOTE — Progress Notes (Signed)
 Lisa Mccullough is a  62 y.o. female who presents for  CHIEF COMPLAINT Chief Complaint  Patient presents with  . Annual Exam    Subjective: History of Present Illness  Pt in NAD. Here for yearly eval, Followed by GYN. Has HLD not on statin and anxiety on meds. Has lost 10 pounds from last year. Feels OK. Labs OK except anemia. Now with left hip and thigh pain x 6 mo. Sleeping OK. No fever or HA's. Denies Cp or SOB. No palpitations. No change in bowels or bladder. Due for MMG and colon.    Past Medical History:  Diagnosis Date  . Allergy   . Anxiety   . Hyperlipidemia    Patient Active Problem List  Diagnosis  . Menopausal state  . Pure hypercholesterolemia  . GAD (generalized anxiety disorder)    Past Surgical History:  Procedure Laterality Date  . Foot Surgery Right 1996  . HYSTERECTOMY  09/11/2008   TVH - Adenomyosis, leiomyomata  . DCR Right 06/12/2015   Tear Duct     Current Outpatient Medications:  .  buPROPion  (WELLBUTRIN  XL) 300 MG XL tablet, Take 1 tablet (300 mg total) by mouth once daily, Disp: 90 tablet, Rfl: 3 .  cetirizine (ZYRTEC) 10 MG tablet, Take 10 mg by mouth once daily, Disp: , Rfl:  .  cholecalciferol  1000 unit tablet, Take by mouth, Disp: , Rfl:  .  estrogens , conjugated, (PREMARIN ) 0.625 MG tablet, Take 1 tablet (0.625 mg total) by mouth once daily, Disp: 90 tablet, Rfl: 3 .  fluticasone  propionate (FLONASE ) 50 mcg/actuation nasal spray, spray 2 sprays into each nostril every day, Disp: 48 g, Rfl: 3 .  imipramine (TOFRANIL) 25 MG tablet, Take 1 tablet (25 mg total) by mouth at bedtime, Disp: 30 tablet, Rfl: 5  Codeine sulfate  Social History   Socioeconomic History  . Marital status: Married  Tobacco Use  . Smoking status: Never  . Smokeless tobacco: Never  Vaping Use  . Vaping status: Never Used  Substance and Sexual Activity  . Alcohol use: Never    Comment: OCCASIONALLY  . Drug use: Never  . Sexual activity: Yes    Partners: Male     Birth control/protection: Surgical  Other Topics Concern  . Would you please tell us  about the people who live in your home, your pets, or anything else important to your social life? Yes   Social Determinants of Health   Financial Resource Strain: Low Risk  (04/21/2023)   Overall Financial Resource Strain (CARDIA)   . Difficulty of Paying Living Expenses: Not hard at all  Food Insecurity: No Food Insecurity (04/21/2023)   Hunger Vital Sign   . Worried About Programme researcher, broadcasting/film/video in the Last Year: Never true   . Ran Out of Food in the Last Year: Never true  Transportation Needs: No Transportation Needs (04/21/2023)   PRAPARE - Transportation   . Lack of Transportation (Medical): No   . Lack of Transportation (Non-Medical): No  Housing Stability: Unknown (04/21/2023)   Housing Stability Vital Sign   . Unable to Pay for Housing in the Last Year: No   . Homeless in the Last Year: No    Family History  Problem Relation Name Age of Onset  . Hyperlipidemia (Elevated cholesterol) Mother Inocente Guitar   . High blood pressure (Hypertension) Mother Inocente Guitar   . Allergic rhinitis Mother Inocente Guitar   . Alzheimer's disease Maternal Grandmother Cathlean Novak     A comprehensive ROS  was negative except for HPI  PE: BP 112/72   Pulse 89   Ht 157.5 cm (5' 2)   Wt 65.3 kg (144 lb)   SpO2 98%   BMI 26.34 kg/m  General: Alert oriented x3  Skin: No suspicious lesions or moles.   Eyes: Sclera and conjunctiva clear; pupils equal round and reactive to light and accommodation; extraocular movements intact Ears: External ears and canal normal; tympanic membranes normal.   Nose: Mucosa healthy without drainage or ulceration Oropharynx: No suspicious lesions Neck: No swelling, masses, stiffness, pain, limited movement, carotid pulses normal bilaterally, thyroid normal size, no masses palpated. No bruits heard. Lungs: Respirations unlabored; clear to auscultation bilaterally Back: No spinal  deformity Cardiovascular: Heart regular rate and rhythm without murmurs, gallops, or rubs Abdomen: Soft; non tender; non distended;  no masses or organomegaly Lymph Nodes: No significant cervical, supraclavicular, or axillary lymphadenopathy noted Musculoskeletal: No active joint inflammation Extremities: Normal, no edema Pulses: Dorsalis pedis palpable and symmetric bilaterally Neurologic: Alert and oriented; speech intact; face symmetrical; moves all extremities well    Goals     .  Lose Weight    . * Maintain health/healthy lifestyle (pt-stated)        Appointment on 04/17/2023  Component Date Value Ref Range Status  . Color 04/17/2023 Yellow  Colorless, Straw, Light Yellow, Yellow, Dark Yellow Final  . Clarity 04/17/2023 Cloudy (!)  Clear Final  . Specific Gravity 04/17/2023 1.025  1.005 - 1.030 Final  . pH, Urine 04/17/2023 6.0  5.0 - 8.0 Final  . Protein, Urinalysis 04/17/2023 Trace (!)  Negative mg/dL Final  . Glucose, Urinalysis 04/17/2023 Negative  Negative mg/dL Final  . Ketones, Urinalysis 04/17/2023 Negative  Negative mg/dL Final  . Blood, Urinalysis 04/17/2023 Negative  Negative Final  . Nitrite, Urinalysis 04/17/2023 Negative  Negative Final  . Leukocyte Esterase, Urinalysis 04/17/2023 Negative  Negative Final  . Bilirubin, Urinalysis 04/17/2023 Negative  Negative Final  . Urobilinogen, Urinalysis 04/17/2023 2.0 (H)  0.2 - 1.0 mg/dL Final  . Mucous, Urine 04/17/2023 PRESENT (!)  None Seen Final  . WBC, UA 04/17/2023 5  <=5 /hpf Final  . Red Blood Cells, Urinalysis 04/17/2023 1  <=3 /hpf Final  . Bacteria, Urinalysis 04/17/2023 5-50 (!)  0 - 5 /hpf Final  . Squamous Epithelial Cells, Urinaly* 04/17/2023 15  /hpf Final  . Calcium Oxalate Crystals 04/17/2023 PRESENT (!)  None Seen Final  . Cholesterol, Total 04/17/2023 214 (H)  100 - 200 mg/dL Final  . Triglyceride 91/69/7975 122  35 - 199 mg/dL Final  . HDL (High Density Lipoprotein) Cho* 04/17/2023 72.9  35.0 - 85.0  mg/dL Final  . LDL Calculated 04/17/2023 882  0 - 130 mg/dL Final  . VLDL Cholesterol 04/17/2023 24  mg/dL Final  . Cholesterol/HDL Ratio 04/17/2023 2.9   Final  . WBC (White Blood Cell Count) 04/17/2023 4.0 (L)  4.1 - 10.2 10^3/uL Final  . RBC (Red Blood Cell Count) 04/17/2023 3.37 (L)  4.04 - 5.48 10^6/uL Final  . Hemoglobin 04/17/2023 11.3 (L)  12.0 - 15.0 gm/dL Final  . Hematocrit 91/69/7975 33.6 (L)  35.0 - 47.0 % Final  . MCV (Mean Corpuscular Volume) 04/17/2023 99.7  80.0 - 100.0 fl Final  . MCH (Mean Corpuscular Hemoglobin) 04/17/2023 33.5 (H)  27.0 - 31.2 pg Final  . MCHC (Mean Corpuscular Hemoglobin * 04/17/2023 33.6  32.0 - 36.0 gm/dL Final  . Platelet Count 04/17/2023 254  150 - 450 10^3/uL Final  . RDW-CV (  Red Cell Distribution Widt* 04/17/2023 12.6  11.6 - 14.8 % Final  . MPV (Mean Platelet Volume) 04/17/2023 9.8  9.4 - 12.4 fl Final  . Neutrophils 04/17/2023 2.20  1.50 - 7.80 10^3/uL Final  . Lymphocytes 04/17/2023 1.42  1.00 - 3.60 10^3/uL Final  . Monocytes 04/17/2023 0.31  0.00 - 1.50 10^3/uL Final  . Eosinophils 04/17/2023 0.07  0.00 - 0.55 10^3/uL Final  . Basophils 04/17/2023 0.03  0.00 - 0.09 10^3/uL Final  . Neutrophil % 04/17/2023 54.6  32.0 - 70.0 % Final  . Lymphocyte % 04/17/2023 35.1  10.0 - 50.0 % Final  . Monocyte % 04/17/2023 7.7  4.0 - 13.0 % Final  . Eosinophil % 04/17/2023 1.7  1.0 - 5.0 % Final  . Basophil% 04/17/2023 0.7  0.0 - 2.0 % Final  . Immature Granulocyte % 04/17/2023 0.2  <=0.7 % Final  . Immature Granulocyte Count 04/17/2023 0.01  <=0.06 10^3/L Final  . Glucose 04/17/2023 88  70 - 110 mg/dL Final  . Sodium 91/69/7975 141  136 - 145 mmol/L Final  . Potassium 04/17/2023 4.1  3.6 - 5.1 mmol/L Final  . Chloride 04/17/2023 104  97 - 109 mmol/L Final  . Carbon Dioxide (CO2) 04/17/2023 29.1  22.0 - 32.0 mmol/L Final  . Urea Nitrogen (BUN) 04/17/2023 18  7 - 25 mg/dL Final  . Creatinine 91/69/7975 0.9  0.6 - 1.1 mg/dL Final  . Glomerular  Filtration Rate (eGFR) 04/17/2023 73  >60 mL/min/1.73sq m Final  . Calcium 04/17/2023 9.4  8.7 - 10.3 mg/dL Final  . AST  91/69/7975 12  8 - 39 U/L Final  . ALT  04/17/2023 11  5 - 38 U/L Final  . Alk Phos (alkaline Phosphatase) 04/17/2023 57  34 - 104 U/L Final  . Albumin 04/17/2023 3.9  3.5 - 4.8 g/dL Final  . Bilirubin, Total 04/17/2023 0.3  0.3 - 1.2 mg/dL Final  . Protein, Total 04/17/2023 6.1  6.1 - 7.9 g/dL Final  . A/G Ratio 91/69/7975 1.8  1.0 - 5.0 gm/dL Final  . Thyroid Stimulating Hormone (TSH) 04/17/2023 2.292  0.450-5.330 uIU/ml uIU/mL Final  . Urine Culture, Routine - [facility] 04/17/2023 Final report   Final  . Result 1 - [facility] 04/17/2023 No growth   Final   DIAGNOSIS: Annual physical exam  (primary encounter diagnosis)  Pure hypercholesterolemia  GAD (generalized anxiety disorder)   PLAN: MMG ordered. Refer to GI for colon. Left hip/leg films today. Prednisone taper. F/u with GYN. Anemia labs 2 weeks. RTC 1 year, sooner if needed     Attestation Statement:   I personally performed the service. (TP)  Reyes JONETTA Costa, MD, MD  *Some images could not be shown.

## 2023-06-09 ENCOUNTER — Other Ambulatory Visit: Payer: Self-pay | Admitting: Internal Medicine

## 2023-06-09 DIAGNOSIS — Z1231 Encounter for screening mammogram for malignant neoplasm of breast: Secondary | ICD-10-CM

## 2023-06-24 ENCOUNTER — Ambulatory Visit
Admission: RE | Admit: 2023-06-24 | Discharge: 2023-06-24 | Disposition: A | Discharge status: Home / Self Care | Payer: Managed Care, Other (non HMO) | Source: Ambulatory Visit | Attending: Internal Medicine | Admitting: Internal Medicine

## 2023-06-24 DIAGNOSIS — Z1231 Encounter for screening mammogram for malignant neoplasm of breast: Secondary | ICD-10-CM | POA: Insufficient documentation

## 2023-08-25 NOTE — Progress Notes (Signed)
 Chief Complaint:   Lisa Mccullough is a 63 y.o. 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 Today's weight 151, last year 178 prior year was 162.  Patient concerns: -None; she does not have concerns about abnormal bleeding.    GYNHx:  -No LMP recorded. Patient has had a hysterectomy..    -She does not have a hx of abnormal pap smears (and therefore will not need further pap smears) -HRT use: Yes, Premarin  0.625mg  daily  Screening: Pertinent lab review:  Last pap: 06/2010 neg/neg - no further paps Last Mammogram: 06/2023 birads1 Colonoscopy: appointment with GI in March BMD: n/a Depression Screening: negative   Counseling:  Sexually active: Yes Concerns: No Contraception: hysterectomy Safe at home: yes Seat belt: Always Lifestyle Habits: Nutrition: reasonable  Exercise: some   Alcohol use:  reports no history of alcohol use. Smoking:  reports that she has never smoked. She has never used smokeless tobacco.  OB/GYN History:  OB History     Gravida  2   Para  2   Term  2   Preterm      AB      Living  2      SAB      IAB      Ectopic      Molar      Multiple      Live Births           Past Medical  History:  has a past medical history of Allergy, Anxiety, and Hyperlipidemia.  Past Surgical History:  has a past surgical history that includes Hysterectomy (09/11/2008); Foot Surgery (Right, 1996); and DCR (Right, 06/12/2015). Family History: family history includes Allergic rhinitis in her mother; Alzheimer's disease in her maternal grandmother; High blood pressure (Hypertension) in her mother; Hyperlipidemia (Elevated cholesterol) in her mother. Social History:  reports that she has never smoked. She has never used smokeless tobacco. She reports that she does not drink alcohol and does not use drugs. Allergies: is allergic to codeine sulfate. Medications: Current Outpatient Medications:  .  buPROPion  (WELLBUTRIN  XL) 300 MG XL tablet, Take 1 tablet (300 mg total) by mouth once daily, Disp: 90 tablet, Rfl: 3 .  cetirizine (ZYRTEC) 10 MG tablet, Take 10 mg by mouth once daily, Disp: , Rfl:  .  cholecalciferol  1000 unit tablet, Take by mouth, Disp: , Rfl:  .  estrogens , conjugated, (PREMARIN ) 0.625 MG tablet, Take 1 tablet (0.625 mg total) by mouth once daily, Disp: 90 tablet, Rfl: 3 .  fluticasone  propionate (FLONASE ) 50 mcg/actuation nasal spray, spray 2 sprays into each nostril every day, Disp: 48 g, Rfl: 3 .  imipramine (TOFRANIL) 25 MG tablet, Take 1 tablet (25 mg total) by mouth at bedtime (  Patient not taking: Reported on 08/25/2023), Disp: 30 tablet, Rfl: 5 .  predniSONE (DELTASONE) 10 MG tablet, Taper 6-6-4-4-2-2-1-1-off (Patient not taking: Reported on 08/25/2023), Disp: 26 tablet, Rfl: 0  Patient Active Problem List  Diagnosis  . Menopausal state  . Pure hypercholesterolemia  . GAD (generalized anxiety disorder)   Review of Systems:   ROS: see HPI for pertinent positives and negatives, otherwise a 10 system review is negative.   Specifically, she denies problems with abdominal pain, pelvic pain, vaginal discharge. Denies bowel problems, bladder problems, incontinence, depression or  breast lumps or masses.   Exam:   BP 102/70   Ht 157.5 cm (5' 2)   Wt 69.2 kg (152 lb 9.6 oz)   BMI 27.91 kg/m   General: Well-developed, well-nourished  female in no acute distress Lungs: CTA  CV : RRR without murmur   Breast: exam done in lying position: No dimpling or retraction, no dominant mass, no spontaneous discharge, no axillary adenopathy Neck:  no thyromegaly, no lymphadenopathy Abdomen: soft, no masses, normal active bowel sounds,  non-tender, no rebound tenderness, no hernias noted Genitalia:     Pelvic exam:         External: Tanner stage 5, normal female genitalia without lesions or masses,  +atrophy        Bladder: Normal size without masses or tenderness, well-supported        Urethra: No lesions or discharge with palpation. Normal urethral size and location, no prolapse        Vagina: normal physiological discharge, without lesions or masses,  +atrophy        Cervix: Surgically absent        Adnexa: normal bimanual exam without masses or fullness        Uterus: Surgically absent         Anus/Perineum: Normal external exam  Erla Baptist was present as chaperone for the more sensitive portions of the physical exam ( such as breast and pelvic)  Chaperone present for pelvic exam.    Impression:   The primary encounter diagnosis was Encounter for gynecological examination without abnormal finding. A diagnosis of Screening for depression was also pertinent to this visit.  Plan:   Visit needs:  - premarin  refills   1. Well woman exam:   Patient is here for her annual gynecological exam.  We reviewed her menstrual history, immunization history, smoking, alcohol and substance abuse risk, depression screening, h/o sexual dysfunction, h/o abuse and current safety at home, dietary assessment, seat belt use.   Diagnoses and all orders for this visit:  Encounter for gynecological examination without abnormal finding  Screening for depression  Other orders -      Cancel: Mammo screening digital bilateral; Future    Return in about 1 year (around 08/24/2024) for Annual.   Attestation Statement:   I personally performed the service. (TP)  BETHANY JOHNATHAN PENTON, MD

## 2024-03-01 ENCOUNTER — Other Ambulatory Visit: Payer: Self-pay

## 2024-03-01 ENCOUNTER — Emergency Department

## 2024-03-01 ENCOUNTER — Inpatient Hospital Stay
Admission: EM | Admit: 2024-03-01 | Discharge: 2024-03-03 | DRG: 201 | Disposition: A | Discharge status: Home / Self Care | Source: Ambulatory Visit | Attending: Internal Medicine | Admitting: Internal Medicine

## 2024-03-01 DIAGNOSIS — W19XXXA Unspecified fall, initial encounter: Secondary | ICD-10-CM

## 2024-03-01 DIAGNOSIS — J9383 Other pneumothorax: Secondary | ICD-10-CM | POA: Diagnosis not present

## 2024-03-01 DIAGNOSIS — R911 Solitary pulmonary nodule: Secondary | ICD-10-CM | POA: Diagnosis present

## 2024-03-01 DIAGNOSIS — F419 Anxiety disorder, unspecified: Secondary | ICD-10-CM | POA: Diagnosis present

## 2024-03-01 DIAGNOSIS — J939 Pneumothorax, unspecified: Principal | ICD-10-CM | POA: Diagnosis present

## 2024-03-01 DIAGNOSIS — E785 Hyperlipidemia, unspecified: Secondary | ICD-10-CM | POA: Diagnosis present

## 2024-03-01 DIAGNOSIS — Z882 Allergy status to sulfonamides status: Secondary | ICD-10-CM

## 2024-03-01 DIAGNOSIS — Z885 Allergy status to narcotic agent status: Secondary | ICD-10-CM

## 2024-03-01 DIAGNOSIS — Z6830 Body mass index (BMI) 30.0-30.9, adult: Secondary | ICD-10-CM

## 2024-03-01 DIAGNOSIS — E669 Obesity, unspecified: Secondary | ICD-10-CM | POA: Diagnosis present

## 2024-03-01 DIAGNOSIS — Z90710 Acquired absence of both cervix and uterus: Secondary | ICD-10-CM

## 2024-03-01 DIAGNOSIS — Z79899 Other long term (current) drug therapy: Secondary | ICD-10-CM

## 2024-03-01 DIAGNOSIS — F418 Other specified anxiety disorders: Secondary | ICD-10-CM | POA: Diagnosis present

## 2024-03-01 DIAGNOSIS — E66811 Obesity, class 1: Secondary | ICD-10-CM | POA: Diagnosis present

## 2024-03-01 DIAGNOSIS — S270XXA Traumatic pneumothorax, initial encounter: Secondary | ICD-10-CM | POA: Diagnosis not present

## 2024-03-01 DIAGNOSIS — F32A Depression, unspecified: Secondary | ICD-10-CM | POA: Diagnosis present

## 2024-03-01 DIAGNOSIS — M25511 Pain in right shoulder: Secondary | ICD-10-CM | POA: Diagnosis present

## 2024-03-01 DIAGNOSIS — W109XXA Fall (on) (from) unspecified stairs and steps, initial encounter: Secondary | ICD-10-CM | POA: Diagnosis present

## 2024-03-01 DIAGNOSIS — M545 Low back pain, unspecified: Secondary | ICD-10-CM | POA: Diagnosis present

## 2024-03-01 HISTORY — DX: Hyperlipidemia, unspecified: E78.5

## 2024-03-01 HISTORY — DX: Other specified anxiety disorders: F41.8

## 2024-03-01 LAB — CBC
HCT: 38.4 % (ref 36.0–46.0)
Hemoglobin: 12.7 g/dL (ref 12.0–15.0)
MCH: 32.5 pg (ref 26.0–34.0)
MCHC: 33.1 g/dL (ref 30.0–36.0)
MCV: 98.2 fL (ref 80.0–100.0)
Platelets: 231 K/uL (ref 150–400)
RBC: 3.91 MIL/uL (ref 3.87–5.11)
RDW: 12.4 % (ref 11.5–15.5)
WBC: 6.8 K/uL (ref 4.0–10.5)
nRBC: 0 % (ref 0.0–0.2)

## 2024-03-01 LAB — TROPONIN I (HIGH SENSITIVITY)
Troponin I (High Sensitivity): 3 ng/L (ref <18)
Troponin I (High Sensitivity): 2 ng/L (ref <18)

## 2024-03-01 LAB — BASIC METABOLIC PANEL WITH GFR
Anion gap: 12 (ref 5–15)
BUN: 10 mg/dL (ref 8–23)
CO2: 25 mmol/L (ref 22–32)
Calcium: 9.2 mg/dL (ref 8.9–10.3)
Chloride: 104 mmol/L (ref 98–111)
Creatinine, Ser: 0.79 mg/dL (ref 0.44–1.00)
GFR, Estimated: >60 mL/min (ref >60)
Glucose, Bld: 95 mg/dL (ref 70–99)
Potassium: 3.7 mmol/L (ref 3.5–5.1)
Sodium: 141 mmol/L (ref 135–145)

## 2024-03-01 MED ORDER — PROPOFOL 10 MG/ML IV BOLUS: 0.5000 mg/kg | Freq: Once | INTRAVENOUS | Status: DC

## 2024-03-01 MED ORDER — LIDOCAINE HCL (PF) 1 % IJ SOLN
INTRAMUSCULAR | Status: AC
  Filled 2024-03-01: qty 10

## 2024-03-01 MED ORDER — MIDAZOLAM HCL 2 MG/2ML IJ SOLN
INTRAMUSCULAR | Status: AC
  Filled 2024-03-01: qty 4

## 2024-03-01 MED ORDER — LIDOCAINE 5 % EX PTCH
1.0000 | MEDICATED_PATCH | CUTANEOUS | Status: DC
  Administered 2024-03-02 (×2): 1 via TRANSDERMAL
  Filled 2024-03-01 (×2): qty 1

## 2024-03-01 MED ORDER — LIDOCAINE-EPINEPHRINE (PF) 1 %-1:200000 IJ SOLN
30.0000 mL | Freq: Once | INTRAMUSCULAR | Status: AC
  Administered 2024-03-01: 30 mL

## 2024-03-01 MED ORDER — MORPHINE SULFATE (PF) 2 MG/ML IV SOLN
2.0000 mg | Freq: Once | INTRAVENOUS | Status: AC
  Administered 2024-03-01: 2 mg via INTRAVENOUS

## 2024-03-01 MED ORDER — MIDAZOLAM HCL 2 MG/2ML IJ SOLN
INTRAMUSCULAR | Status: AC
  Administered 2024-03-01: 2 mg
  Filled 2024-03-01: qty 2

## 2024-03-01 MED ORDER — MIDAZOLAM HCL 2 MG/2ML IJ SOLN
2.0000 mg | Freq: Once | INTRAMUSCULAR | Status: AC
  Administered 2024-03-01: 2 mg via INTRAVENOUS

## 2024-03-01 MED ORDER — ACETAMINOPHEN 325 MG PO TABS: 650.0000 mg | ORAL_TABLET | Freq: Four times a day (QID) | ORAL | Status: DC | PRN

## 2024-03-01 MED ORDER — MORPHINE SULFATE (PF) 4 MG/ML IV SOLN
INTRAVENOUS | Status: AC
  Filled 2024-03-01: qty 1

## 2024-03-01 MED ORDER — FENTANYL CITRATE PF 50 MCG/ML IJ SOSY
25.0000 ug | PREFILLED_SYRINGE | INTRAMUSCULAR | Status: DC | PRN
  Administered 2024-03-02 (×2): 25 ug via INTRAVENOUS
  Filled 2024-03-01 (×2): qty 1

## 2024-03-01 MED ORDER — ALBUTEROL SULFATE (2.5 MG/3ML) 0.083% IN NEBU: 2.5000 mg | INHALATION_SOLUTION | RESPIRATORY_TRACT | Status: DC | PRN

## 2024-03-01 MED ORDER — HYDROMORPHONE HCL 1 MG/ML IJ SOLN
1.0000 mg | Freq: Once | INTRAMUSCULAR | Status: AC
  Administered 2024-03-01: 1 mg via INTRAVENOUS
  Filled 2024-03-01: qty 1

## 2024-03-01 MED ORDER — MIDAZOLAM BOLUS VIA INFUSION: 2.0000 mg | Freq: Once | INTRAVENOUS | Status: DC

## 2024-03-01 MED ORDER — ONDANSETRON HCL 4 MG/2ML IJ SOLN
4.0000 mg | Freq: Three times a day (TID) | INTRAMUSCULAR | Status: DC | PRN
  Administered 2024-03-02: 4 mg via INTRAVENOUS
  Filled 2024-03-01: qty 2

## 2024-03-01 MED ORDER — METHOCARBAMOL 500 MG PO TABS
500.0000 mg | ORAL_TABLET | Freq: Three times a day (TID) | ORAL | Status: DC | PRN
  Administered 2024-03-02: 500 mg via ORAL
  Filled 2024-03-01: qty 1

## 2024-03-01 MED ORDER — LIDOCAINE HCL (PF) 1 % IJ SOLN
INTRAMUSCULAR | Status: AC
  Administered 2024-03-01: 10 mL
  Filled 2024-03-01: qty 10

## 2024-03-01 MED ORDER — LIDOCAINE-EPINEPHRINE (PF) 1 %-1:200000 IJ SOLN
INTRAMUSCULAR | Status: AC
  Filled 2024-03-01: qty 30

## 2024-03-01 MED ORDER — PROPOFOL 10 MG/ML IV BOLUS
1.0000 mg/kg | Freq: Once | INTRAVENOUS | Status: AC
  Administered 2024-03-01: 72.6 mg via INTRAVENOUS

## 2024-03-01 MED ORDER — PROPOFOL 10 MG/ML IV BOLUS
INTRAVENOUS | Status: AC
  Filled 2024-03-01: qty 20

## 2024-03-01 MED ORDER — DM-GUAIFENESIN ER 30-600 MG PO TB12: 1.0000 | ORAL_TABLET | Freq: Two times a day (BID) | ORAL | Status: DC | PRN

## 2024-03-01 MED ORDER — MORPHINE SULFATE (PF) 2 MG/ML IV SOLN
INTRAVENOUS | Status: AC
  Filled 2024-03-01: qty 1

## 2024-03-01 MED ORDER — OXYCODONE-ACETAMINOPHEN 5-325 MG PO TABS
1.0000 | ORAL_TABLET | ORAL | Status: DC | PRN
  Administered 2024-03-02 (×2): 1 via ORAL
  Filled 2024-03-01 (×2): qty 1

## 2024-03-01 NOTE — ED Triage Notes (Signed)
 First nurse note: Pt to ED via POV from [facility]. Pt reports CP and SOB that started at 11am. Pain has been constant and sharp. Pt went to get dry needling done for back and shoulder. HR 116 at [facility]

## 2024-03-01 NOTE — ED Notes (Signed)
 Set up room for procedure, pt's husband is going to wait in waiting room. Pt is calm awaiting procedure

## 2024-03-01 NOTE — ED Notes (Signed)
 Dr. Jacolyn at bedside, reviewed chest tube insertion with pt pt verbalizes understaning

## 2024-03-01 NOTE — Progress Notes (Signed)
 See nursing notes.  Refer patient to the ER for evaluation of acute onset of sternal chest pain, shortness of breath, and tachycardia.  Patient is out of breath with normal speech.  Patient had dry needling earlier today.

## 2024-03-01 NOTE — ED Provider Notes (Signed)
 [facility] Provider Note    Event Date/Time   First MD Initiated Contact with Patient 03/01/24 1832     (approximate)   History   Chest Pain   HPI  Lisa Mccullough is a 63 y.o. female with a history of hyperlipidemia and anxiety who presents with chest pain, acute onset around midday today, mainly in the right side of her chest, and associated with shortness of breath.  The patient states that she had dry needling done on her right upper torso this morning although the needles were quite small.  She denies any dizziness or lightheadedness.  She has no cough or fever.  I reviewed the past medical records.  The patient's most recent outpatient encounter was on 1/7 with OB/GYN for an annual exam.   Physical Exam   Triage Vital Signs: ED Triage Vitals  Encounter Vitals Group     BP 03/01/24 1745 124/73     Girls Systolic BP Percentile --      Girls Diastolic BP Percentile --      Boys Systolic BP Percentile --      Boys Diastolic BP Percentile --      Pulse Rate 03/01/24 1745 (!) 105     Resp 03/01/24 1745 18     Temp 03/01/24 1745 98.1 F (36.7 C)     Temp src --      SpO2 03/01/24 1745 100 %     Weight 03/01/24 1744 160 lb (72.6 kg)     Height 03/01/24 1744 5' 1 (1.549 m)     Head Circumference --      Peak Flow --      Pain Score 03/01/24 1744 5     Pain Loc --      Pain Education --      Exclude from Growth Chart --     Most recent vital signs: Vitals:   03/01/24 2250 03/01/24 2255  BP: (!) 106/58 106/63  Pulse: 87 81  Resp: 14 16  Temp:  98.3 F (36.8 C)  SpO2: 97% 98%     General: Awake, no distress.  CV:  Good peripheral perfusion.  Resp:  Normal effort.  Diminished breath sounds to right lung. Abd:  No distention.  Other:  No peripheral edema.   ED Results / Procedures / Treatments   Labs (all labs ordered are listed, but only abnormal results are displayed) Labs Reviewed  BASIC METABOLIC PANEL WITH GFR  CBC   TROPONIN I (HIGH SENSITIVITY)  TROPONIN I (HIGH SENSITIVITY)     EKG  ED ECG REPORT I, Waylon Cassis, the attending physician, personally viewed and interpreted this ECG.  Date: 03/01/2024 EKG Time: 1745 Rate: 104 Rhythm: Sinus tachycardia QRS Axis: Right axis Intervals: normal ST/T Wave abnormalities: normal Narrative Interpretation: no evidence of acute ischemia    RADIOLOGY  Chest x-ray: I independently viewed and interpreted the images; there is a large right pneumothorax  PROCEDURES:  Critical Care performed: No  CHEST TUBE INSERTION  Date/Time: 03/01/2024 10:57 PM  Performed by: Cassis Waylon, MD Authorized by: Cassis Waylon, MD   Consent:    Consent obtained:  Written   Consent given by:  Patient   Risks, benefits, and alternatives were discussed: yes     Risks discussed:  Bleeding, incomplete drainage, nerve damage, damage to surrounding structures, infection and pain Universal protocol:    Patient identity confirmed:  Arm band Sedation:    Sedation type:  Anxiolysis Anesthesia:  Anesthesia method:  Local infiltration   Local anesthetic:  Lidocaine  1% w/o epi Procedure details:    Placement location:  R lateral   Tube size (Fr):  12   Ultrasound guidance: yes     Tension pneumothorax: no     Drainage characteristics: None. Post-procedure details:    Procedure completion:  Procedure terminated electively by provider Comments:     After two attempts to place a percutaneous tube with no return of air, the procedure was aborted.  The patient tolerated the procedure well with no immediate complications.  Post procedure X-ray showed no change.   .Sedation  Date/Time: 03/01/2024 10:59 PM  Performed by: Jacolyn Pae, MD Authorized by: Jacolyn Pae, MD   Consent:    Consent obtained:  Written (electronic informed consent)   Consent given by:  Patient   Risks discussed:  Allergic reaction, dysrhythmia, inadequate  sedation, respiratory compromise necessitating ventilatory assistance and intubation, prolonged sedation necessitating reversal and prolonged hypoxia resulting in organ damage   Alternatives discussed:  Anxiolysis Universal protocol:    Procedure explained and questions answered to patient or proxy's satisfaction: yes     Relevant documents present and verified: yes     Test results available: yes     Imaging studies available: yes     Required blood products, implants, devices, and special equipment available: yes     Immediately prior to procedure, a time out was called: yes     Patient identity confirmed:  Arm band and verbally with patient Indications:    Procedure performed:  Chest tube placement   Procedure necessitating sedation performed by:  Different physician Pre-sedation assessment:    Time since last food or drink:  6 hours   ASA classification: class 2 - patient with mild systemic disease     Mallampati score:  I - soft palate, uvula, fauces, pillars visible   Pre-sedation assessments completed and reviewed: airway patency, cardiovascular function, mental status, nausea/vomiting, pain level and respiratory function   A pre-sedation assessment was completed prior to the start of the procedure Immediate pre-procedure details:    Reviewed: vital signs     Verified: bag valve mask available, emergency equipment available, intubation equipment available, IV patency confirmed, oxygen available, reversal medications available and suction available   Procedure details (see MAR for exact dosages):    Sedation:  Propofol    Intended level of sedation: deep   Analgesia:  Morphine    Intra-procedure monitoring:  Blood pressure monitoring, continuous pulse oximetry, cardiac monitor, frequent vital sign checks, frequent LOC assessments and continuous capnometry   Intra-procedure events: none     Total Provider sedation time (minutes):  10 Post-procedure details:   A post-sedation  assessment was completed following the completion of the procedure.   Attendance: Constant attendance by certified staff until patient recovered     Recovery: Patient returned to pre-procedure baseline     Post-sedation assessments completed and reviewed: airway patency, cardiovascular function, hydration status, mental status, nausea/vomiting, pain level and respiratory function     Patient is stable for discharge or admission: yes     Procedure completion:  Tolerated well, no immediate complications Comments:     This procedure note is for the deep sedation that was performed by me, during the chest tube placement performed by Dr. Jordis.    MEDICATIONS ORDERED IN ED: Medications  lidocaine  (PF) (XYLOCAINE ) 1 % injection (has no administration in time range)  propofol  (DIPRIVAN ) 10 mg/mL bolus/IV push (has no administration in time  range)  HYDROmorphone  (DILAUDID ) injection 1 mg (has no administration in time range)  oxyCODONE -acetaminophen  (PERCOCET/ROXICET) 5-325 MG per tablet 1 tablet (has no administration in time range)  fentaNYL  (SUBLIMAZE ) injection 25 mcg (has no administration in time range)  albuterol  (VENTOLIN  HFA) 108 (90 Base) MCG/ACT inhaler 2 puff (has no administration in time range)  dextromethorphan-guaiFENesin  (MUCINEX  DM) 30-600 MG per 12 hr tablet 1 tablet (has no administration in time range)  ondansetron  (ZOFRAN ) injection 4 mg (has no administration in time range)  acetaminophen  (TYLENOL ) tablet 650 mg (has no administration in time range)  lidocaine  (PF) (XYLOCAINE ) 1 % injection (10 mLs  Given 03/01/24 2025)  midazolam  (VERSED ) 2 MG/2ML injection (2 mg  Given 03/01/24 2006)  midazolam  (VERSED ) injection 2 mg (2 mg Intravenous Given 03/01/24 2026)  lidocaine -EPINEPHrine  (PF) (XYLOCAINE -EPINEPHrine ) 1 %-1:200000 (PF) injection 30 mL (30 mLs Infiltration Given by Other 03/01/24 2237)  morphine  (PF) 2 MG/ML injection 2 mg (2 mg Intravenous Given 03/01/24 2235)  propofol   (DIPRIVAN ) 10 mg/mL bolus/IV push 72.6 mg (72.6 mg Intravenous Given 03/01/24 2235)     IMPRESSION / MDM / ASSESSMENT AND PLAN / ED COURSE  I reviewed the triage vital signs and the nursing notes.  63 year old female with PMH as noted above presents with chest pain, shortness of breath, and absent lung sounds on exam.  Her vital signs are normal.  O2 saturation is in the high 90s on room air.  Chest x-ray reveals a large right-sided pneumothorax with no findings concerning for tension pneumothorax.  Differential diagnosis includes, but is not limited to, pneumothorax.  Is unclear if it is related to the dry needling although I doubt it based on the size of the needles used. BMP and CBC were obtained and show no acute findings.  Troponin is negative.  We will perform a chest tube.  Patient's presentation is most consistent with acute presentation with potential threat to life or bodily function.  The patient is on the cardiac monitor to evaluate for evidence of arrhythmia and/or significant heart rate changes.  ----------------------------------------- 10:56 PM on 03/01/2024 -----------------------------------------  I was unable to successfully obtain a percutaneous chest tube after 2 attempts.  I consulted and discussed the case with Dr. Jordis from general surgery who requested a CT and agreed to come in and place the chest tube.  He has now placed the tube successfully.  We will admit to the hospitalist.  ----------------------------------------- 11:13 PM on 03/01/2024 -----------------------------------------  I consulted Dr. Hilma from the hospitalist service; based on our discussion he agrees to evaluate the patient for admission.    FINAL CLINICAL IMPRESSION(S) / ED DIAGNOSES   Final diagnoses:  Pneumothorax, unspecified type     Rx / DC Orders   ED Discharge Orders     None        Note:  This document was prepared using Dragon voice recognition software and may  include unintentional dictation errors.    Jacolyn Pae, MD 03/01/24 [phone]

## 2024-03-01 NOTE — ED Notes (Signed)
 MD Siadecki  at [facility], Rn at bedside and this RN at bedside attending to pt to start Chest tube insertion

## 2024-03-01 NOTE — Consult Note (Signed)
 Patient ID: Lisa Mccullough, female   DOB: Sep 01, 1960, 63 y.o.   MRN: [id number]  HPI Lisa Mccullough is a 63 y.o. female seen in consultation at the request of Dr. Jacolyn for large pneumothorax.  She reports that she had an acute onset of right sided chest pain associated with shortness of breath.  The pain is moderate to severe worsening with deep inspiration and sharp.  No specific alleviating factors.  She reports that she had some dry acupuncture today.  She also reports that about 12 days ago she had a fall and she did hit her torso.  She denies any history of smoking no evidence of COPD. She did have a chest x-ray that I have personally reviewed showing a large pneumothorax.  ER physician attempted percutaneous pigtail thoracostomy tube but was unsuccessful.  We went ahead and perform a CT scan to make sure there was no other chest wall abnormalities and there is evidence of a large right pneumothorax.  There is no blood and there is no other evidence of trauma.  Her CBC and BMP is completely normal.  She is otherwise healthy and is able to perform more than 4 METS of activity without any shortness of breath or chest pain.  HPI  History reviewed. No pertinent past medical history.  Past Surgical History:  Procedure Laterality Date   ABDOMINAL HYSTERECTOMY      Family History  Problem Relation Age of Onset   Breast cancer Neg Hx     Social History Social History   Tobacco Use   Smoking status: Never   Smokeless tobacco: Never  Substance Use Topics   Alcohol use: No    Alcohol/week: 0.0 standard drinks of alcohol   Drug use: No    Allergies  Allergen Reactions   Codeine Sulfate Other (See Comments)    Current Facility-Administered Medications  Medication Dose Route Frequency Provider Last Rate Last Admin   HYDROmorphone  (DILAUDID ) injection 1 mg  1 mg Intravenous Once Siadecki, Sebastian, MD       lidocaine  (PF) (XYLOCAINE ) 1 % injection            propofol  (DIPRIVAN ) 10  mg/mL bolus/IV push            Current Outpatient Medications  Medication Sig Dispense Refill   bacitracin ophthalmic ointment Use small amount on sutures 4 times a day for 12-14 days.  Discontinue if allergy symptoms develop and call our office.     buPROPion  (BUDEPRION XL) 300 MG 24 hr tablet Take by mouth.     Cholecalciferol  (D 1000) 1000 UNITS capsule Take by mouth.     diclofenac  (VOLTAREN ) 75 MG EC tablet Take 1 tablet (75 mg total) by mouth 2 (two) times daily. 60 tablet 1   estrogens , conjugated, (PREMARIN ) 0.625 MG tablet TAKE 1 TABLET (0.625 MG TOTAL) BY MOUTH ONCE DAILY.     fluconazole  (DIFLUCAN ) 150 MG tablet Take 1 tablet (150 mg total) by mouth once. 1 tablet 0   fluticasone  (FLONASE ) 50 MCG/ACT nasal spray USE 2 SPRAYS IN EACH NOSTRIL ONCE DAILY 16 g 12   ibuprofen  (ADVIL ) 800 MG tablet Take 1 tablet (800 mg total) by mouth every 8 (eight) hours as needed. 90 tablet 3   methocarbamol  (ROBAXIN ) 500 MG tablet Take 1 tablet (500 mg total) 4 (four) times daily by mouth. 20 tablet 0   methylPREDNISolone  (MEDROL  DOSEPAK) 4 MG TBPK tablet 6 day dose pack - take as directed 21 tablet 0  naproxen  (NAPROSYN ) 500 MG tablet Take 1 tablet (500 mg total) by mouth 2 (two) times daily with a meal. 30 tablet 0   neomycin-polymyxin-dexamethasone (MAXITROL) 0.1 % ophthalmic suspension Use one drop in Operated Eye 4 times a day for 2 weeks then stop     oxyCODONE -acetaminophen  (PERCOCET) 5-325 MG tablet Take 1 tablet every 6 (six) hours as needed by mouth for severe pain. 20 tablet 0     Review of Systems Full ROS  was asked and was negative except for the information on the HPI  Physical Exam Blood pressure 106/63, pulse 81, temperature 98.3 F (36.8 C), temperature source Oral, resp. rate 16, height 5' 1 (1.549 m), weight 72.6 kg, SpO2 98%. CONSTITUTIONAL: NAD. EYES: Pupils are equal, round, Sclera are non-icteric. EARS, NOSE, MOUTH AND THROAT: The oropharynx is clear. The oral mucosa is  pink and moist. Hearing is intact to voice. LYMPH NODES:  Lymph nodes in the neck are normal. Neck No JVD trach midline, no LAD RESPIRATORY:  Lungs are absent on the right , nml BS on the Left. No crepitus clear. There is normal respiratory effort, with equal breath sounds bilaterally, and without pathologic use of accessory muscles.There is chest tenderness 5-8 IC spaces, prior small subcutaneous hematoma from attempted tube placement CARDIOVASCULAR: Heart is regular without murmurs, gallops, or rubs. GI: The abdomen is  soft, nontender, and nondistended. There are no palpable masses. There is no hepatosplenomegaly. There are normal bowel sounds in all quadrants. GU: Rectal deferred.   MUSCULOSKELETAL: Normal muscle strength and tone. No cyanosis or edema.   SKIN: Turgor is good and there are no pathologic skin lesions or ulcers. NEUROLOGIC: Motor and sensation is grossly normal. Cranial nerves are grossly intact. PSYCH:  Oriented to person, place and time. Affect is normal.  Data Reviewed I have personally reviewed the patient's imaging, laboratory findings and medical records.    Assessment/Plan 63 year old female with large pneumothorax likely related to recent fall.  Clinically there is evidence of tenderness on chest wall suggesting some contusion to the chest wall.  She definitely needs urgent chest tube.  Will trying to do a small thoracostomy tube and if needed will do our regular chest tube.  Procedure discussed with the patient and the husband in detail.  The risk the benefits and the possible complications including but not limited to: Bleeding, infection, injury to adjacent organs including liver, pain.  He understands and wishes to proceed.  I have talked to Dr. Jacolyn and he will gladly administer conscious sedation with propofol  We will continue to follow her.  We will place a chest tube to suction I personally spent a total of 75 minutes in the care of the patient today including  performing a medically appropriate exam/evaluation, counseling and educating, placing orders, referring and communicating with other health care professionals, documenting clinical information in the EHR, independently interpreting and reviewing images studies and coordinating care.   Procedure note. Right thoracostomy tube percutaneous using a 14 French Wayne catheter  Findings: Right PTX  Anesthesia: lidocaine  1% w epi 30cc and IV propofol  by ER MD  EBL: minimal  After informed consent was obtained her right chest was prepped and draped in the usual sterile fashion using anterior axillary line and fifth intercostal space we performed 2 good intercostal nerve blocks using lidocaine  1% with epinephrine .  After that a needle guide was placed percutaneously and we were able to pierce into the pleural cavity.  There was evidence of a gush of  air.  Using a wire and a modified sided technique a 14 French catheter pigtail catheter was placed without any resistance bleeding or complications.  The catheter was attached to the Pleur-evac and then was secured to the chest wall using nylon suture.  There were no evidence of complications  Laneta Luna, MD FACS General Surgeon 03/01/2024, 11:01 PM

## 2024-03-01 NOTE — H&P (Signed)
 History and Physical    Lisa Mccullough FMW:[id number] DOB: Jul 31, 1961 DOA: 03/01/2024  Referring MD/NP/PA:   PCP: Verdon Keen, MD   Patient coming from:  The patient is coming from home.     Chief Complaint: Chest pain and SOB  HPI: Lisa Mccullough is a 63 y.o. female with medical history significant of hyperlipidemia, depression with anxiety, obesity, who presents with chest pain and SOB.  Patient states that she was on the beach and accidentally fell down 10 stairs on 7/4.  No SOB. Patient strongly denies head injury and does not want to do CT scan of the head. She injured her lower back and right shoulder, causing pain in lower back and right shoulder.  The pain is constant, aching, moderately, nonradiating. She went to have acupuncture dry needling done to the right upper torso at about 8:00 AM.  At about 11 AM, she gradually started having SOB and chest pain.  The chest pain is located to the right side of chest, moderate to severe, sharp, nonradiating, not aggravated or alleviated by any known factors.  She has dry cough, no fever or chills.  Patient does not have nausea, vomiting, diarrhea or abdominal pain.  No symptoms of UTI.    Patient was found to have large right pneumothorax by chest x-ray.  ED physician tried to place a chest tube without success, then consulted Dr. Jordis of surgery, who successfully placed the chest tube for patient.  Data reviewed independently and ED Course: pt was found to have WBC 6.8, GFR> 60, troponin 3 --> 2.  Temperature normal, blood pressure 106/63, heart rate 105 --> 81, RR 21- > 16, oxygen saturation 98% on 4L oxygen.  Patient is placed in PCU for observation.   Chest x-ray: Large right pneumothorax.  No mediastinal shift.  CT of chest: 1. Large right-sided pneumothorax. No mediastinal shift. 2. Right lung pulmonary nodules measuring up to 6 mm. Non-contrast chest CT at 3-6 months is recommended. If the nodules are stable at time of repeat  CT, then future CT at 18-24 months (from today's scan) is considered optional for low-risk patients, but is recommended for high-risk patients. This recommendation follows the consensus statement: Guidelines for Management of Incidental Pulmonary Nodules Detected on CT Images: From the Fleischner Society 2017; Radiology 2017; 284:228-243.  CXR after chest tube placement: New right-sided chest tube in place with interval re-expansion of the right lung. Small right apical pneumothorax persists measuring 6 mm at the lung apex.   EKG: I have personally reviewed.  Sinus rhythm, QTc 428, borderline right axis deviation, early R wave question.   Review of Systems:   General: no fevers, chills, no body weight gain, has fatigue HEENT: no blurry vision, hearing changes or sore throat Respiratory: has dyspnea, coughing, no wheezing CV: has chest pain, no palpitations GI: no nausea, vomiting, abdominal pain, diarrhea, constipation GU: no dysuria, burning on urination, increased urinary frequency, hematuria  Ext: no leg edema Neuro: no unilateral weakness, numbness, or tingling, no vision change or hearing loss Skin: no rash, no skin tear. MSK: has lower back pain, right shoulder pain Heme: No easy bruising.  Travel history: No recent long distant travel.   Allergy:  Allergies  Allergen Reactions   Codeine Sulfate Other (See Comments)    Past Medical History:  Diagnosis Date   Depression with anxiety    HLD (hyperlipidemia)     Past Surgical History:  Procedure Laterality Date   ABDOMINAL HYSTERECTOMY  Social History:  reports that she has never smoked. She has never used smokeless tobacco. She reports that she does not drink alcohol and does not use drugs.  Family History:  Family History  Problem Relation Age of Onset   Breast cancer Neg Hx      Prior to Admission medications   Medication Sig Start Date End Date Taking? Authorizing Provider  bacitracin ophthalmic  ointment Use small amount on sutures 4 times a day for 12-14 days.  Discontinue if allergy symptoms develop and call our office. 06/12/15   [provider]  buPROPion  (BUDEPRION XL) 300 MG 24 hr tablet Take by mouth. 08/08/14   [provider]  Cholecalciferol  (D 1000) 1000 UNITS capsule Take by mouth.    [provider]  diclofenac  (VOLTAREN ) 75 MG EC tablet Take 1 tablet (75 mg total) by mouth 2 (two) times daily. 09/14/18   Janit Thresa HERO, DPM  estrogens , conjugated, (PREMARIN ) 0.625 MG tablet TAKE 1 TABLET (0.625 MG TOTAL) BY MOUTH ONCE DAILY. 04/02/15   [provider]  fluconazole  (DIFLUCAN ) 150 MG tablet Take 1 tablet (150 mg total) by mouth once. 08/22/15   Fisher, Devere ORN, PA-C  fluticasone  (FLONASE ) 50 MCG/ACT nasal spray USE 2 SPRAYS IN EACH NOSTRIL ONCE DAILY 12/29/16   Gasper Devere ORN, PA-C  ibuprofen  (ADVIL ) 800 MG tablet Take 1 tablet (800 mg total) by mouth every 8 (eight) hours as needed. 05/10/19   Janit Thresa HERO, DPM  methocarbamol  (ROBAXIN ) 500 MG tablet Take 1 tablet (500 mg total) 4 (four) times daily by mouth. 07/06/17   Saunders, Shona CROME, PA-C  methylPREDNISolone  (MEDROL  DOSEPAK) 4 MG TBPK tablet 6 day dose pack - take as directed 10/08/18   Evans, Brent M, DPM  naproxen  (NAPROSYN ) 500 MG tablet Take 1 tablet (500 mg total) by mouth 2 (two) times daily with a meal. 09/27/18   Janit Thresa HERO, DPM  neomycin-polymyxin-dexamethasone (MAXITROL) 0.1 % ophthalmic suspension Use one drop in Operated Eye 4 times a day for 2 weeks then stop 06/12/15   [provider]  oxyCODONE -acetaminophen  (PERCOCET) 5-325 MG tablet Take 1 tablet every 6 (six) hours as needed by mouth for severe pain. 07/06/17   Saunders Shona CROME, PA-C    Physical Exam: Vitals:   03/01/24 2245 03/01/24 2250 03/01/24 2255 03/01/24 2315  BP: 108/61 (!) 106/58 106/63 119/63  Pulse: 86 87 81 87  Resp: (!) 21 14 16 20   Temp: 98.2 F (36.8 C)  98.3 F (36.8 C)   TempSrc: Oral   Oral   SpO2: 91% 97% 98% 97%  Weight:      Height:       General: Not in acute distress HEENT:       Eyes: PERRL, EOMI, no jaundice       ENT: No discharge from the ears and nose, no pharynx injection, no tonsillar enlargement.        Neck: No JVD, no bruit, no mass felt. Heme: No neck lymph node enlargement. Cardiac: S1/S2, RRR, No murmurs, No gallops or rubs. Respiratory: No rales, wheezing, rhonchi or rubs.  S/p of right chest tube placement GI: Soft, nondistended, nontender, no rebound pain, no organomegaly, BS present. GU: No hematuria Ext: No pitting leg edema bilaterally. 1+DP/PT pulse bilaterally. Musculoskeletal: No joint deformities, No joint redness or warmth, no limitation of ROM in spin.  Has tenderness to right shoulder and lower back Skin: No rashes.  Neuro: Alert, oriented X3, cranial nerves II-XII grossly intact,  moves all extremities normally.  Psych: Patient is not psychotic, no suicidal or hemocidal ideation.  Labs on Admission: I have personally reviewed following labs and imaging studies  CBC: Recent Labs  Lab 03/01/24 1745  WBC 6.8  HGB 12.7  HCT 38.4  MCV 98.2  PLT 231   Basic Metabolic Panel: Recent Labs  Lab 03/01/24 1745  NA 141  K 3.7  CL 104  CO2 25  GLUCOSE 95  BUN 10  CREATININE 0.79  CALCIUM 9.2   GFR: Estimated Creatinine Clearance: 67.3 mL/min (by C-G formula based on SCr of 0.79 mg/dL). Liver Function Tests: No results for input(s): AST, ALT, ALKPHOS, BILITOT, PROT, ALBUMIN in the last 168 hours. No results for input(s): LIPASE, AMYLASE in the last 168 hours. No results for input(s): AMMONIA in the last 168 hours. Coagulation Profile: No results for input(s): INR, PROTIME in the last 168 hours. Cardiac Enzymes: No results for input(s): CKTOTAL, CKMB, CKMBINDEX, TROPONINI in the last 168 hours. BNP (last 3 results) No results for input(s): PROBNP in the last 8760 hours. HbA1C: No results for  input(s): HGBA1C in the last 72 hours. CBG: No results for input(s): GLUCAP in the last 168 hours. Lipid Profile: No results for input(s): CHOL, HDL, LDLCALC, TRIG, CHOLHDL, LDLDIRECT in the last 72 hours. Thyroid Function Tests: No results for input(s): TSH, T4TOTAL, FREET4, T3FREE, THYROIDAB in the last 72 hours. Anemia Panel: No results for input(s): VITAMINB12, FOLATE, FERRITIN, TIBC, IRON, RETICCTPCT in the last 72 hours. Urine analysis: No results found for: COLORURINE, APPEARANCEUR, LABSPEC, PHURINE, GLUCOSEU, HGBUR, BILIRUBINUR, KETONESUR, PROTEINUR, UROBILINOGEN, NITRITE, LEUKOCYTESUR Sepsis Labs: @LABRCNTIP (procalcitonin:4,lacticidven:4) )No results found for this or any previous visit (from the past 240 hours).   Radiological Exams on Admission:   Assessment/Plan Principal Problem:   Pneumothorax on right Active Problems:   HLD (hyperlipidemia)   Lower back pain   Right shoulder pain   Depression with anxiety   Lung nodule   Obesity (BMI 30-39.9)   Assessment and Plan:  Pneumothorax on right: pt has large right pneumothorax.  S/p of right chest tube placement by Dr. Jordis.  Repeated x-ray showed interval re-expansion of the right lung, with small right apical pneumothorax persists measuring 6 mm at the lung apex.  -will place in tele bed follow-up position. - Pain control: As needed fentanyl , Percocet, Tylenol  - As needed albuterol  and Mucinex   HLD (hyperlipidemia): Patient's not taking medications currently. -Follow-up with PCP  Lower back pain and Right shoulder pain -Pain control: As needed fentanyl , Percocet, Tylenol , Lidoderm  to lower back - As needed Robaxin  - Will get x-ray of lower back and right shoulder pneumonia (after patient is fully stabilized)  Depression with anxiety -Continue bupropion   Lung nodule: This is incidental findings of by CT scan -Follow-up with PCP as outpatient to  repeat CT scan in 3 to 6 months.  Obesity (BMI 30-39.9): Patient has Obesity Class I, with body weight 72.6 Kg and BMI 30..23 kg/m2.  - Encourage losing weight - Exercise and healthy diet     DVT ppx: SCD  Code Status: Full code  Family Communication: Yes, patient's husband at bed side.    Disposition Plan:  Anticipate discharge back to previous environment  Consults called: Dr. Jordis for surgery  Admission status and Level of care: Progressive:    for obs    Dispo: The patient is from: Home              Anticipated d/c is to: Home  Anticipated d/c date is: 1 day              Patient currently is not medically stable to d/c.    Severity of Illness:  The appropriate patient status for this patient is OBSERVATION. Observation status is judged to be reasonable and necessary in order to provide the required intensity of service to ensure the patient's safety. The patient's presenting symptoms, physical exam findings, and initial radiographic and laboratory data in the context of their medical condition is felt to place them at decreased risk for further clinical deterioration. Furthermore, it is anticipated that the patient will be medically stable for discharge from the hospital within 2 midnights of admission.        Date of Service 03/02/2024    Caleb Exon [facility]   If 7PM-7AM, please contact night-coverage [email or web address] 03/02/2024, 12:01 AM

## 2024-03-01 NOTE — ED Notes (Addendum)
 Pt tolerated procedure well unfortunately tube insertion was not successful, MD Siadecki explained pt next step on plan of care. Pt verbalizes understanding. Pt is awake, alert, reports soreness ro right rib area,  no change in her breathing . Will continue to monitor

## 2024-03-01 NOTE — ED Notes (Signed)
 Pt to CT

## 2024-03-01 NOTE — ED Notes (Signed)
Dr. Siadecki at bedside 

## 2024-03-01 NOTE — ED Triage Notes (Signed)
 Pt comes with c/o mid sternal cp and sob. Pt had dry needling done at PT this morning. Pt states this started after her PT.

## 2024-03-02 ENCOUNTER — Observation Stay

## 2024-03-02 DIAGNOSIS — J939 Pneumothorax, unspecified: Secondary | ICD-10-CM | POA: Diagnosis present

## 2024-03-02 DIAGNOSIS — Z6830 Body mass index (BMI) 30.0-30.9, adult: Secondary | ICD-10-CM | POA: Diagnosis not present

## 2024-03-02 DIAGNOSIS — W19XXXA Unspecified fall, initial encounter: Secondary | ICD-10-CM | POA: Diagnosis not present

## 2024-03-02 DIAGNOSIS — Z885 Allergy status to narcotic agent status: Secondary | ICD-10-CM | POA: Diagnosis not present

## 2024-03-02 DIAGNOSIS — S270XXA Traumatic pneumothorax, initial encounter: Secondary | ICD-10-CM | POA: Diagnosis not present

## 2024-03-02 DIAGNOSIS — M25511 Pain in right shoulder: Secondary | ICD-10-CM | POA: Diagnosis present

## 2024-03-02 DIAGNOSIS — E66811 Obesity, class 1: Secondary | ICD-10-CM | POA: Diagnosis present

## 2024-03-02 DIAGNOSIS — M545 Low back pain, unspecified: Secondary | ICD-10-CM | POA: Diagnosis present

## 2024-03-02 DIAGNOSIS — R911 Solitary pulmonary nodule: Secondary | ICD-10-CM | POA: Diagnosis present

## 2024-03-02 DIAGNOSIS — E785 Hyperlipidemia, unspecified: Secondary | ICD-10-CM | POA: Diagnosis present

## 2024-03-02 DIAGNOSIS — J9383 Other pneumothorax: Secondary | ICD-10-CM | POA: Diagnosis present

## 2024-03-02 DIAGNOSIS — Z79899 Other long term (current) drug therapy: Secondary | ICD-10-CM | POA: Diagnosis not present

## 2024-03-02 DIAGNOSIS — Z882 Allergy status to sulfonamides status: Secondary | ICD-10-CM | POA: Diagnosis not present

## 2024-03-02 DIAGNOSIS — W109XXA Fall (on) (from) unspecified stairs and steps, initial encounter: Secondary | ICD-10-CM | POA: Diagnosis present

## 2024-03-02 DIAGNOSIS — F32A Depression, unspecified: Secondary | ICD-10-CM | POA: Diagnosis present

## 2024-03-02 DIAGNOSIS — Z90710 Acquired absence of both cervix and uterus: Secondary | ICD-10-CM | POA: Diagnosis not present

## 2024-03-02 DIAGNOSIS — F419 Anxiety disorder, unspecified: Secondary | ICD-10-CM | POA: Diagnosis present

## 2024-03-02 LAB — CBC
HCT: 39.9 % (ref 36.0–46.0)
Hemoglobin: 13.1 g/dL (ref 12.0–15.0)
MCH: 33 pg (ref 26.0–34.0)
MCHC: 32.8 g/dL (ref 30.0–36.0)
MCV: 100.5 fL — ABNORMAL HIGH (ref 80.0–100.0)
Platelets: 221 K/uL (ref 150–400)
RBC: 3.97 MIL/uL (ref 3.87–5.11)
RDW: 12.5 % (ref 11.5–15.5)
WBC: 7.2 K/uL (ref 4.0–10.5)
nRBC: 0 % (ref 0.0–0.2)

## 2024-03-02 LAB — HIV ANTIBODY (ROUTINE TESTING W REFLEX): HIV Screen 4th Generation wRfx: NONREACTIVE

## 2024-03-02 LAB — APTT: aPTT: 22 s — ABNORMAL LOW (ref 24–36)

## 2024-03-02 LAB — PROTIME-INR
INR: 0.9 (ref 0.8–1.2)
Prothrombin Time: 13.1 s (ref 11.4–15.2)

## 2024-03-02 MED ORDER — ACETAMINOPHEN 500 MG PO TABS
1000.0000 mg | ORAL_TABLET | Freq: Four times a day (QID) | ORAL | Status: DC
  Administered 2024-03-02 – 2024-03-03 (×6): 1000 mg via ORAL
  Filled 2024-03-02 (×6): qty 2

## 2024-03-02 MED ORDER — CYCLOBENZAPRINE HCL 10 MG PO TABS
5.0000 mg | ORAL_TABLET | Freq: Three times a day (TID) | ORAL | Status: DC
  Administered 2024-03-02 – 2024-03-03 (×5): 5 mg via ORAL
  Filled 2024-03-02 (×5): qty 1

## 2024-03-02 MED ORDER — FLUTICASONE PROPIONATE 50 MCG/ACT NA SUSP
2.0000 | Freq: Every day | NASAL | Status: DC
  Filled 2024-03-02: qty 16

## 2024-03-02 MED ORDER — VITAMIN D 25 MCG (1000 UNIT) PO TABS
1000.0000 [IU] | ORAL_TABLET | Freq: Every day | ORAL | Status: DC
  Administered 2024-03-02 – 2024-03-03 (×2): 1000 [IU] via ORAL
  Filled 2024-03-02 (×2): qty 1

## 2024-03-02 MED ORDER — BUPROPION HCL ER (XL) 150 MG PO TB24
300.0000 mg | ORAL_TABLET | Freq: Every day | ORAL | Status: DC
  Administered 2024-03-02 – 2024-03-03 (×2): 300 mg via ORAL
  Filled 2024-03-02 (×2): qty 2

## 2024-03-02 MED ORDER — ESTROGENS CONJUGATED 0.625 MG PO TABS
0.6250 mg | ORAL_TABLET | Freq: Every day | ORAL | Status: DC
  Administered 2024-03-02 – 2024-03-03 (×2): 0.625 mg via ORAL
  Filled 2024-03-02 (×2): qty 1

## 2024-03-02 MED ORDER — PREGABALIN 50 MG PO CAPS
100.0000 mg | ORAL_CAPSULE | Freq: Three times a day (TID) | ORAL | Status: DC
  Administered 2024-03-02 – 2024-03-03 (×5): 100 mg via ORAL
  Filled 2024-03-02 (×5): qty 2

## 2024-03-02 MED ORDER — KETOROLAC TROMETHAMINE 30 MG/ML IJ SOLN
30.0000 mg | Freq: Four times a day (QID) | INTRAMUSCULAR | Status: DC
  Administered 2024-03-02 – 2024-03-03 (×5): 30 mg via INTRAVENOUS
  Filled 2024-03-02 (×5): qty 1

## 2024-03-02 MED ORDER — OXYCODONE HCL 5 MG PO TABS: 5.0000 mg | ORAL_TABLET | ORAL | Status: DC | PRN

## 2024-03-02 NOTE — Assessment & Plan Note (Signed)
 Right shoulder pain. Pain seems improving.  Imaging negative for any acute abnormality, only shows degenerative changes. -Continue with supportive care and pain management

## 2024-03-02 NOTE — Assessment & Plan Note (Signed)
-   Continue home bupropion

## 2024-03-02 NOTE — Assessment & Plan Note (Signed)
 Large pneumothorax on admission which has significant improvement with only tiny residual remaining this morning s/p chest tube placement by general surgery. -Continue to monitor -Will repeat chest x-ray tomorrow and if remains stable most likely clamping will start. - Continue supportive care

## 2024-03-02 NOTE — Hospital Course (Addendum)
 Taken from H&P.  Lisa Mccullough is a 63 y.o. female with medical history significant of hyperlipidemia, depression with anxiety, obesity, who presents with chest pain and SOB.   Patient states that [place] and [place] 10 stairs on 7/4.  No SOB. Patient strongly denies head injury and does not want to do CT scan of the head. She injured her lower back and right shoulder, causing pain in lower back and right shoulder. She went to have acupuncture dry needling done to the right upper torso at about 8:00 AM.  At about 11 AM, she gradually started having SOB and right-sided chest pain.   Patient was found to have large right pneumothorax by chest x-ray.  ED physician tried to place a chest tube without success, then consulted Dr. Jordis of surgery, who successfully placed the chest tube for patient.   Otherwise vitals and labs stable.  Chest x-ray with large right-sided pneumothorax, no mediastinal shift.  CT chest with large right sided pneumothorax, also found to have a right lung pulmonary nodule which will need a repeat CT in 3 to 46-month.  Repeat chest x-ray after chest tube placement with interval reexpansion of right lung and a small right apical pneumothorax persist.  7/16: Vital labs stable.  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  X-ray of lumbar spine was also negative for any acute abnormality, multilevel degenerative disc disease. Repeat chest x-ray this morning with improving pneumothorax, only tiny residual pneumothorax noted.  7/17: Patient remained hemodynamically stable.  Chest tube was removed and no recurrence of pneumothorax on repeat chest x-ray.  Patient will continue on current medications and need to have a close follow-up with her providers for further assistance.  She will also need a repeat CT chest for pulmonary nodule follow-up.

## 2024-03-02 NOTE — Progress Notes (Signed)
 CC: PTX Subjective: CXR pers reviewed good expansion of lung. CT in place, Minimal apical ptx Having pain related to tube, worse when deep inspiration, severe and sharp  Objective: Vital signs in last 24 hours: Temp:  [97.7 F (36.5 C)-98.3 F (36.8 C)] 97.8 F (36.6 C) (07/16 0650) Pulse Rate:  [73-105] 74 (07/16 0843) Resp:  [14-21] 19 (07/16 0843) BP: (90-130)/(52-94) 90/65 (07/16 0843) SpO2:  [91 %-100 %] 100 % (07/16 0843) Weight:  [72.6 kg] 72.6 kg (07/15 1744) Last BM Date : 03/01/24  Intake/Output from previous day: No intake/output data recorded. Intake/Output this shift: No intake/output data recorded.  Physical exam:  NAD alert Chest CTA bilaterally CT in place, No evidence of airleak Abd Soft, nt no peritonitis Ext: well perfused and no edema  Lab Results: CBC  Recent Labs    03/01/24 1745 03/02/24 0602  WBC 6.8 7.2  HGB 12.7 13.1  HCT 38.4 39.9  PLT 231 221   BMET Recent Labs    03/01/24 1745  NA 141  K 3.7  CL 104  CO2 25  GLUCOSE 95  BUN 10  CREATININE 0.79  CALCIUM 9.2   PT/INR Recent Labs    03/02/24 0601  LABPROT 13.1  INR 0.9   ABG No results for input(s): PHART, HCO3 in the last 72 hours.  Invalid input(s): PCO2, PO2  Studies/Results: DG Shoulder Right Result Date: 03/02/2024 CLINICAL DATA:  Shoulder pain which started at 11 a.m. EXAM: RIGHT SHOULDER - 2+ VIEW COMPARISON:  None Available. FINDINGS: Right-sided pigtail thoracostomy tube is again noted overlying the right mid lung. No signs of right shoulder fracture or dislocation. Spurring at the acromioclavicular joint noted. No other significant bone abnormality. IMPRESSION: 1. No acute findings. 2. Right acromioclavicular osteoarthritis. Electronically Signed   By: Waddell Calk M.D.   On: 03/02/2024 07:25   DG Lumbar Spine 2-3 Views Result Date: 03/02/2024 CLINICAL DATA:  Back pain EXAM: LUMBAR SPINE - 2-3 VIEW COMPARISON:  None Available. FINDINGS: There is mild  stepwise retrolisthesis L1 on L2, L2 on L3 and L3 on L4. The alignment is otherwise normal. The lumbar vertebral body heights are well preserved. Multilevel disc space narrowing and endplate spurring is most severe at the L1-2 level. No signs of acute fracture or subluxation. IMPRESSION: 1. No acute findings. 2. Multilevel degenerative disc disease. Electronically Signed   By: Waddell Calk M.D.   On: 03/02/2024 07:23   DG CHEST PORT 1 VIEW Result Date: 03/02/2024 CLINICAL DATA:  Evaluate pneumothorax. Reports chest pain and shortness of breath. EXAM: PORTABLE CHEST 1 VIEW COMPARISON:  03/01/2024 FINDINGS: Right-sided pigtail thoracostomy tube overlies the periphery of the right mid lung. Barely visible tiny right apical pneumothorax appears decreased in volume from the prior exam. No pleural effusion, interstitial edema or airspace disease. Mild atelectasis within the lung bases as before. IMPRESSION: 1. Right-sided pigtail thoracostomy tube in place. 2. Tiny right apical pneumothorax appears decreased in volume from the prior exam. Electronically Signed   By: Waddell Calk M.D.   On: 03/02/2024 07:20   DG CHEST PORT 1 VIEW Result Date: 03/01/2024 CLINICAL DATA:  Chest tube EXAM: PORTABLE CHEST 1 VIEW COMPARISON:  Chest x-ray 03/01/2024 FINDINGS: There is a new right-sided chest tube in place with mild right chest wall emphysema. There is been interval re-expansion of the right lung. Small right apical pneumothorax persists measuring 6 mm at the lung apex. There is minimal atelectasis in the lung bases. Cardiomediastinal silhouette is within normal limits.  No acute fractures are seen. IMPRESSION: New right-sided chest tube in place with interval re-expansion of the right lung. Small right apical pneumothorax persists measuring 6 mm at the lung apex. Electronically Signed   By: Greig Pique M.D.   On: 03/01/2024 23:08   CT CHEST WO CONTRAST Result Date: 03/01/2024 CLINICAL DATA:  Shortness of breath and  chest pain.  Pneumothorax. EXAM: CT CHEST WITHOUT CONTRAST TECHNIQUE: Multidetector CT imaging of the chest was performed following the standard protocol without IV contrast. RADIATION DOSE REDUCTION: This exam was performed according to the departmental dose-optimization program which includes automated exposure control, adjustment of the mA and/or kV according to patient size and/or use of iterative reconstruction technique. COMPARISON:  None Available. FINDINGS: Cardiovascular: No significant vascular findings. Normal heart size. No pericardial effusion. Mediastinum/Nodes: No enlarged mediastinal or axillary lymph nodes. Thyroid gland, trachea, and esophagus demonstrate no significant findings. Lungs/Pleura: There is a large right-sided pneumothorax. There is no mediastinal shift. There is atelectasis in the upper lobe, right middle lobe and right lower lobe. There is a right lower lobe pulmonary nodule measuring 6 mm. There is a 3 mm right upper lobe nodule image 4/39. There is scarring in the left lung apex. Left lung is otherwise clear. Trachea and central airways are patent. Upper Abdomen: No acute abnormality. Musculoskeletal: No fracture is seen. IMPRESSION: 1. Large right-sided pneumothorax. No mediastinal shift. 2. Right lung pulmonary nodules measuring up to 6 mm. Non-contrast chest CT at 3-6 months is recommended. If the nodules are stable at time of repeat CT, then future CT at 18-24 months (from today's scan) is considered optional for low-risk patients, but is recommended for high-risk patients. This recommendation follows the consensus statement: Guidelines for Management of Incidental Pulmonary Nodules Detected on CT Images: From the Fleischner Society 2017; Radiology 2017; 284:228-243. Electronically Signed   By: Greig Pique M.D.   On: 03/01/2024 21:23   DG Chest Port 1 View Result Date: 03/01/2024 CLINICAL DATA:  Pneumothorax EXAM: PORTABLE CHEST 1 VIEW COMPARISON:  Chest x-ray 03/02/2023  FINDINGS: Large right pneumothorax appears unchanged from prior. There is no mediastinal shift. There is right lower lobe atelectasis. Left lung is clear. Cardiomediastinal silhouette is within normal limits. No acute fractures are identified. IMPRESSION: Unchanged large right pneumothorax. No mediastinal shift. Electronically Signed   By: Greig Pique M.D.   On: 03/01/2024 20:46   DG Chest 2 View Result Date: 03/01/2024 CLINICAL DATA:  cp and dyspnea. Recent dry needling done for back and shoulder. EXAM: CHEST - 2 VIEW COMPARISON:  None available. FINDINGS: Large right pneumothorax. Patchy opacities in the right mid and lower lung zones, likely atelectasis. No pleural effusion. No mediastinal shift. No cardiomegaly. No acute fracture or destructive lesion. IMPRESSION: Large right pneumothorax.  No mediastinal shift. Critical Value/emergent results were called by telephone at the time of interpretation on 03/01/2024 at 6:15 pm to provider ARTIST KERNS MD , who verbally acknowledged these results. Electronically Signed   By: Rogelia Myers M.D.   On: 03/01/2024 18:19    Anti-infectives: Anti-infectives (From admission, onward)    None       Assessment/Plan: PTX responded to chest tube May do water seal Pulm toilet, I/S and mobilize I spent extensive time personally showing her IS She is nervous and in pain  I will add scheduled toradol  and lyrica  I personally spent a total of 50 minutes in the care of the patient today including performing a medically appropriate exam/evaluation, counseling and educating, placing  orders, referring and communicating with other health care professionals, documenting clinical information in the EHR, independently interpreting and reviewing images studies and coordinating care.     Laneta Luna, MD, [facility]  03/02/2024

## 2024-03-02 NOTE — Progress Notes (Signed)
 Progress Note   Patient: Lisa Mccullough FMW:[id number] DOB: 1961-04-15 DOA: 03/01/2024     0 DOS: the patient was seen and examined on 03/02/2024   Brief hospital course: Taken from H&P.  Lisa Mccullough is a 63 y.o. female with medical history significant of hyperlipidemia, depression with anxiety, obesity, who presents with chest pain and SOB.   Patient states that [place] the [place] and accidentally fell down 10 stairs on 7/4.  No SOB. Patient strongly denies head injury and does not want to do CT scan of the head. She injured her lower back and right shoulder, causing pain in lower back and right shoulder. She went to have acupuncture dry needling done to the right upper torso at about 8:00 AM.  At about 11 AM, she gradually started having SOB and right-sided chest pain.   Patient was found to have large right pneumothorax by chest x-ray.  ED physician tried to place a chest tube without success, then consulted Dr. Jordis of surgery, who successfully placed the chest tube for patient.   Otherwise vitals and labs stable.  Chest x-ray with large right-sided pneumothorax, no mediastinal shift.  CT chest with large right sided pneumothorax, also found to have a right lung pulmonary nodule which will need a repeat CT in 3 to 72-month.  Repeat chest x-ray after chest tube placement with interval reexpansion of right lung and a small right apical pneumothorax persist.  7/16: Vital labs stable.  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  X-ray of lumbar spine was also negative for any acute abnormality, multilevel degenerative disc disease. Repeat chest x-ray this morning with improving pneumothorax, only tiny residual pneumothorax noted.  Assessment and Plan: * Pneumothorax on right Large pneumothorax on admission which has significant improvement with only tiny residual remaining this morning s/p chest tube placement by general surgery. -Continue  to monitor -Will repeat chest x-ray tomorrow and if remains stable most likely clamping will start. - Continue supportive care  Lower back pain Right shoulder pain. Pain seems improving.  Imaging negative for any acute abnormality, only shows degenerative changes. -Continue with supportive care and pain management  Lung nodule Incidental finding on CT chest. -Follow-up with PCP as outpatient to repeat CT scan in 3 to 6 months.   HLD (hyperlipidemia) Patient is not taking any medications at this time. - Out patient follow-up by PCP  Depression with anxiety - Continue home bupropion   Obesity (BMI 30-39.9) Estimated body mass index is 30.23 kg/m as calculated from the following:   Height as of this encounter: 5' 1 (1.549 m).   Weight as of this encounter: 72.6 kg.   Class I obesity. Encouraged weight loss.   Subjective: Patient was sitting comfortably in chair when seen today.  Having some discomfort at chest tube insertion site.  No shortness of breath.  Physical Exam: Vitals:   03/02/24 0930 03/02/24 1035 03/02/24 1045 03/02/24 1130  BP:   (!) 99/59   Pulse: 68 67 73 70  Resp: (!) 22 17 16 17   Temp:      TempSrc:      SpO2: 99% 100% 98% 98%  Weight:      Height:       General.  Well-developed lady, in no acute distress. Pulmonary.  Lungs clear bilaterally, normal respiratory effort. CV.  Regular rate and rhythm, no JVD, rub or murmur. Abdomen.  Soft, nontender, nondistended, BS positive. CNS.  Alert and oriented .  No focal neurologic deficit. Extremities.  No edema, no cyanosis, pulses intact and symmetrical. Psychiatry.  Judgment and insight appears normal.   Data Reviewed: Prior data reviewed  Family Communication: Discussed with patient  Disposition: Status is: Inpatient Remains inpatient appropriate because: Severity of illness  Planned Discharge Destination: Home  Time spent: 50 minutes  This record has been created using Manufacturing engineer. Errors have been sought and corrected,but may not always be located. Such creation errors do not reflect on the standard of care.   Author: Amaryllis Dare, MD 03/02/2024 12:59 PM  For on call review [email or web address].

## 2024-03-02 NOTE — Assessment & Plan Note (Signed)
 Incidental finding on CT chest. -Follow-up with PCP as outpatient to repeat CT scan in 3 to 6 months.

## 2024-03-02 NOTE — ED Notes (Signed)
 This RN assisted pt while ambulating to the bathroom. NAD. Pt in recliner at this time.

## 2024-03-02 NOTE — Assessment & Plan Note (Signed)
 Estimated body mass index is 30.23 kg/m as calculated from the following:   Height as of this encounter: 5' 1 (1.549 m).   Weight as of this encounter: 72.6 kg.   Class I obesity. Encouraged weight loss.

## 2024-03-02 NOTE — ED Notes (Signed)
 Pt reports that her BP always runs low. Took manual BP with pressure of 96/68

## 2024-03-02 NOTE — Assessment & Plan Note (Signed)
 Patient is not taking any medications at this time. - Out patient follow-up by PCP

## 2024-03-03 ENCOUNTER — Inpatient Hospital Stay

## 2024-03-03 DIAGNOSIS — W19XXXA Unspecified fall, initial encounter: Secondary | ICD-10-CM | POA: Diagnosis not present

## 2024-03-03 DIAGNOSIS — M25511 Pain in right shoulder: Secondary | ICD-10-CM

## 2024-03-03 DIAGNOSIS — S270XXA Traumatic pneumothorax, initial encounter: Secondary | ICD-10-CM | POA: Diagnosis not present

## 2024-03-03 DIAGNOSIS — J939 Pneumothorax, unspecified: Secondary | ICD-10-CM | POA: Diagnosis not present

## 2024-03-03 DIAGNOSIS — R911 Solitary pulmonary nodule: Secondary | ICD-10-CM | POA: Diagnosis not present

## 2024-03-03 DIAGNOSIS — M545 Low back pain, unspecified: Secondary | ICD-10-CM

## 2024-03-03 MED ORDER — PREGABALIN 100 MG PO CAPS: 100.0000 mg | ORAL_CAPSULE | Freq: Two times a day (BID) | ORAL | 0 refills | Status: DC

## 2024-03-03 MED ORDER — LIDOCAINE 5 % EX PTCH: 1.0000 | MEDICATED_PATCH | CUTANEOUS | 0 refills | Status: DC

## 2024-03-03 NOTE — Plan of Care (Signed)

## 2024-03-03 NOTE — Progress Notes (Signed)
 Per Dr Amaryllis pt to follow up with sx in 1 week not obgyn. Pt called and advised. Pt voices understanding.

## 2024-03-03 NOTE — Progress Notes (Signed)
 CC: PTX Subjective: XR pers reviewed good expansion of lung. CT in place,  no air leak on water seal Having pain related to tube, worse when deep inspiration,pain is better   Objective: Vital signs in last 24 hours: Temp:  [97.7 F (36.5 C)-98.3 F (36.8 C)] 98.3 F (36.8 C) (07/17 1138) Pulse Rate:  [73-78] 75 (07/17 1138) Resp:  [18-19] 18 (07/17 0327) BP: (87-104)/(36-68) 99/47 (07/17 1138) SpO2:  [96 %-100 %] 97 % (07/17 1138) Last BM Date : 03/01/24  Intake/Output from previous day: No intake/output data recorded. Intake/Output this shift: Total I/O In: 100 [P.O.:100] Out: 5 [Chest Tube:5]  Physical exam:   NAD alert Chest CTA bilaterally CT in place, No evidence of airleak, removed w/o issues  Abd Soft, nt no peritonitis Ext: well perfused and no edema  Lab Results: CBC  Recent Labs    03/01/24 1745 03/02/24 0602  WBC 6.8 7.2  HGB 12.7 13.1  HCT 38.4 39.9  PLT 231 221   BMET Recent Labs    03/01/24 1745  NA 141  K 3.7  CL 104  CO2 25  GLUCOSE 95  BUN 10  CREATININE 0.79  CALCIUM 9.2   PT/INR Recent Labs    03/02/24 0601  LABPROT 13.1  INR 0.9   ABG No results for input(s): PHART, HCO3 in the last 72 hours.  Invalid input(s): PCO2, PO2  Studies/Results: DG CHEST PORT 1 VIEW Result Date: 03/03/2024 CLINICAL DATA:  Pneumothorax. EXAM: PORTABLE CHEST 1 VIEW COMPARISON:  03/02/2024 FINDINGS: Right sided pigtail thoracostomy tube overlies the periphery of the right upper lobe. No visible pneumothorax identified at this time. Heart size and mediastinal contours are unremarkable. There is no pleural effusion or interstitial edema. No airspace opacities identified. Visualized osseous structures are unremarkable. IMPRESSION: Right-sided pigtail thoracostomy tube overlies the periphery of the right upper lobe. No visible pneumothorax identified at this time. Electronically Signed   By: Waddell Calk M.D.   On: 03/03/2024 07:16   DG Shoulder  Right Result Date: 03/02/2024 CLINICAL DATA:  Shoulder pain which started at 11 a.m. EXAM: RIGHT SHOULDER - 2+ VIEW COMPARISON:  None Available. FINDINGS: Right-sided pigtail thoracostomy tube is again noted overlying the right mid lung. No signs of right shoulder fracture or dislocation. Spurring at the acromioclavicular joint noted. No other significant bone abnormality. IMPRESSION: 1. No acute findings. 2. Right acromioclavicular osteoarthritis. Electronically Signed   By: Waddell Calk M.D.   On: 03/02/2024 07:25   DG Lumbar Spine 2-3 Views Result Date: 03/02/2024 CLINICAL DATA:  Back pain EXAM: LUMBAR SPINE - 2-3 VIEW COMPARISON:  None Available. FINDINGS: There is mild stepwise retrolisthesis L1 on L2, L2 on L3 and L3 on L4. The alignment is otherwise normal. The lumbar vertebral body heights are well preserved. Multilevel disc space narrowing and endplate spurring is most severe at the L1-2 level. No signs of acute fracture or subluxation. IMPRESSION: 1. No acute findings. 2. Multilevel degenerative disc disease. Electronically Signed   By: Waddell Calk M.D.   On: 03/02/2024 07:23   DG CHEST PORT 1 VIEW Result Date: 03/02/2024 CLINICAL DATA:  Evaluate pneumothorax. Reports chest pain and shortness of breath. EXAM: PORTABLE CHEST 1 VIEW COMPARISON:  03/01/2024 FINDINGS: Right-sided pigtail thoracostomy tube overlies the periphery of the right mid lung. Barely visible tiny right apical pneumothorax appears decreased in volume from the prior exam. No pleural effusion, interstitial edema or airspace disease. Mild atelectasis within the lung bases as before. IMPRESSION: 1. Right-sided pigtail  thoracostomy tube in place. 2. Tiny right apical pneumothorax appears decreased in volume from the prior exam. Electronically Signed   By: Waddell Calk M.D.   On: 03/02/2024 07:20   DG CHEST PORT 1 VIEW Result Date: 03/01/2024 CLINICAL DATA:  Chest tube EXAM: PORTABLE CHEST 1 VIEW COMPARISON:  Chest x-ray  03/01/2024 FINDINGS: There is a new right-sided chest tube in place with mild right chest wall emphysema. There is been interval re-expansion of the right lung. Small right apical pneumothorax persists measuring 6 mm at the lung apex. There is minimal atelectasis in the lung bases. Cardiomediastinal silhouette is within normal limits. No acute fractures are seen. IMPRESSION: New right-sided chest tube in place with interval re-expansion of the right lung. Small right apical pneumothorax persists measuring 6 mm at the lung apex. Electronically Signed   By: Greig Pique M.D.   On: 03/01/2024 23:08   CT CHEST WO CONTRAST Result Date: 03/01/2024 CLINICAL DATA:  Shortness of breath and chest pain.  Pneumothorax. EXAM: CT CHEST WITHOUT CONTRAST TECHNIQUE: Multidetector CT imaging of the chest was performed following the standard protocol without IV contrast. RADIATION DOSE REDUCTION: This exam was performed according to the departmental dose-optimization program which includes automated exposure control, adjustment of the mA and/or kV according to patient size and/or use of iterative reconstruction technique. COMPARISON:  None Available. FINDINGS: Cardiovascular: No significant vascular findings. Normal heart size. No pericardial effusion. Mediastinum/Nodes: No enlarged mediastinal or axillary lymph nodes. Thyroid gland, trachea, and esophagus demonstrate no significant findings. Lungs/Pleura: There is a large right-sided pneumothorax. There is no mediastinal shift. There is atelectasis in the upper lobe, right middle lobe and right lower lobe. There is a right lower lobe pulmonary nodule measuring 6 mm. There is a 3 mm right upper lobe nodule image 4/39. There is scarring in the left lung apex. Left lung is otherwise clear. Trachea and central airways are patent. Upper Abdomen: No acute abnormality. Musculoskeletal: No fracture is seen. IMPRESSION: 1. Large right-sided pneumothorax. No mediastinal shift. 2. Right  lung pulmonary nodules measuring up to 6 mm. Non-contrast chest CT at 3-6 months is recommended. If the nodules are stable at time of repeat CT, then future CT at 18-24 months (from today's scan) is considered optional for low-risk patients, but is recommended for high-risk patients. This recommendation follows the consensus statement: Guidelines for Management of Incidental Pulmonary Nodules Detected on CT Images: From the Fleischner Society 2017; Radiology 2017; 284:228-243. Electronically Signed   By: Greig Pique M.D.   On: 03/01/2024 21:23   DG Chest Port 1 View Result Date: 03/01/2024 CLINICAL DATA:  Pneumothorax EXAM: PORTABLE CHEST 1 VIEW COMPARISON:  Chest x-ray 03/02/2023 FINDINGS: Large right pneumothorax appears unchanged from prior. There is no mediastinal shift. There is right lower lobe atelectasis. Left lung is clear. Cardiomediastinal silhouette is within normal limits. No acute fractures are identified. IMPRESSION: Unchanged large right pneumothorax. No mediastinal shift. Electronically Signed   By: Greig Pique M.D.   On: 03/01/2024 20:46   DG Chest 2 View Result Date: 03/01/2024 CLINICAL DATA:  cp and dyspnea. Recent dry needling done for back and shoulder. EXAM: CHEST - 2 VIEW COMPARISON:  None available. FINDINGS: Large right pneumothorax. Patchy opacities in the right mid and lower lung zones, likely atelectasis. No pleural effusion. No mediastinal shift. No cardiomegaly. No acute fracture or destructive lesion. IMPRESSION: Large right pneumothorax.  No mediastinal shift. Critical Value/emergent results were called by telephone at the time of interpretation on 03/01/2024 at  6:15 pm to provider ARTIST KERNS MD , who verbally acknowledged these results. Electronically Signed   By: Rogelia Myers M.D.   On: 03/01/2024 18:19    Anti-infectives: Anti-infectives (From admission, onward)    None       Assessment/Plan: PTS resolved CT removed and CXR after looks good good  expansion more importantly she  clinically doing well and able to be DC home . No need for surgical intervention Continue pulm toilette I will see her in my office in a couple of weeks  I personally spent a total of 50 minutes in the care of the patient today including performing a medically appropriate exam/evaluation, counseling and educating, placing orders, referring and communicating with other health care professionals, documenting clinical information in the EHR, independently interpreting and reviewing images studies and coordinating care.     Laneta Luna, MD, [facility]  03/03/2024

## 2024-03-03 NOTE — Discharge Instructions (Signed)
Pneumothorax A pneumothorax is commonly called a collapsed lung. It is a condition in which air leaks from a lung and builds up between the thin layer of tissue that covers the lungs (visceral pleura)and the interior wall of the chest cavity (parietal pleura). The air gets trapped outside the lung, between the lung and the chest wall (pleural space). The air takes up space and prevents the lung from fully expanding. This condition sometimes occurs suddenly with no apparent cause. The buildup of air may be small or large. A small pneumothorax may go away on its own. A large pneumothorax will require treatment and hospitalization. What are the causes? This condition may be caused by: Trauma and injury to the chest wall. Surgery and other medical procedures. A complication of an underlying lung problem, especially chronic obstructive pulmonary disease (COPD) or emphysema. Sometimes the cause of this condition is not known. What increases the risk? You are more likely to develop this condition if: You have an underlying lung problem. You smoke. You are 56-45 years old, female, tall, and underweight. You have a personal or family history of pneumothorax. You have an eating disorder (anorexia nervosa). This condition can also happen quickly, even in people with no history of lung problems. What are the signs or symptoms? Sometimes a pneumothorax will have no symptoms. When symptoms are present, they may include: Chest pain. Shortness of breath. Increased rate of breathing. Bluish color to the skin, lips, or fingernails (cyanosis). How is this diagnosed? This condition may be diagnosed by: A medical history and physical exam. A chest X-ray, chest CT scan, or ultrasound. How is this treated? Treatment depends on how severe your condition is. The goal of treatment is to remove the extra air and allow your lung to expand back to its normal size. For a small pneumothorax: No treatment may be  needed. Extra oxygen is sometimes used to make it go away more quickly. For a large pneumothorax or one that is causing symptoms, a procedure is done to release the air from around your lungs. To do this, a health care provider may use: A needle with a syringe. This is used to suck air from a pleural space where no additional leakage is taking place. A chest tube. This is used to suck air where there is ongoing leakage into the pleural space. The chest tube may need to remain in place for several days until the air leak has healed. In more severe cases, surgery may be needed to repair the damage that is causing the leak. If you have multiple pneumothorax episodes or have an air leak that will not heal, a procedure called a pleurodesis may be done. A medicine is placed in the pleural space to irritate the tissues around the lung so that the lung will stick to the chest wall, seal any leaks, and stop any buildup of air in that space. If you have an underlying lung problem, severe symptoms, or a large pneumothorax you will usually need to stay in the hospital. Follow these instructions at home: Lifestyle Do not use any products that contain nicotine or tobacco. These products include cigarettes, chewing tobacco, and vaping devices, such as e-cigarettes. If you need help quitting, ask your health care provider. Do not lift anything that is heavier than 10 lb (4.5 kg), or the limit that you are told, until your health care provider says that it is safe. Avoid activities that take a lot of effort (are strenuous) for as long as  told by your health care provider. Return to your normal activities as told by your health care provider. Ask your health care provider what activities are safe for you. Do not fly in an airplane or scuba dive until your health care provider says it is okay. General instructions Take over-the-counter and prescription medicines only as told by your health care provider. If a cough or  pain makes it difficult for you to sleep at night, try sleeping in a semi-upright position in a recliner or by using 2 or 3 pillows. If you had a chest tube and it was removed, ask your health care provider when you can remove the bandage (dressing). While the dressing is in place, do not allow it to get wet. Keep all follow-up visits. This is important. Contact a health care provider if: You cough up thick mucus (sputum) that is yellow or green. You were treated with a chest tube, and you have redness, increasing pain, or discharge at the site where it was placed. Get help right away if: You have increasing chest pain or shortness of breath. You have a cough that will not go away. You begin coughing up blood. You have pain that is getting worse or is not controlled with medicines. The site where your chest tube was located opens up. You feel air coming out of the site where the chest tube was placed. You have a fever or symptoms that last for more than 2-3 days. Your skin, lips, or fingernails turn blue. These symptoms may represent a serious problem that is an emergency. Do not wait to see if the symptoms will go away. Get medical help right away. Call your local emergency services (911 in the [place] Do not drive yourself to the hospital. Summary A pneumothorax, commonly called a collapsed lung, is a condition in which air leaks from a lung and gets trapped between the lung and the chest wall (pleural space). The buildup of air may be small or large. A small pneumothorax may go away on its own. A large pneumothorax will require treatment and hospitalization. Treatment for this condition depends on how severe the pneumothorax is. The goal of treatment is to remove the extra air and allow the lung to expand back to its normal size. Get help right away if you have increasing chest pain or shortness of breath. This information is not intended to replace advice given to you by your health care  provider. Make sure you discuss any questions you have with your health care provider. Document Revised: 01/10/2021 Document Reviewed: 01/10/2021 Elsevier Patient Education  2024 [employer].

## 2024-03-03 NOTE — TOC CM/SW Note (Signed)
 Transition of Care [facility]) - Inpatient Brief Assessment   Patient Details  Name: Lisa Mccullough MRN: [id number] Date of Birth: June 17, 1961  [facility]) CM/SW Contact:    Lauraine JAYSON Carpen, LCSW Phone Number: 03/03/2024, 4:37 PM   Clinical Narrative: Patient has orders to discharge home today. Chart reviewed. No TOC needs identified. CSW signing off.  Transition of Care Asessment: Insurance and Status: Insurance coverage has been reviewed Patient has primary care physician: Yes Home environment has been reviewed: Single family home Prior level of function:: Not documented Prior/Current Home Services: No current home services Social Drivers of Health Review: SDOH reviewed no interventions necessary Readmission risk has been reviewed: Yes Transition of care needs: no transition of care needs at this time

## 2024-03-03 NOTE — Progress Notes (Signed)
 Pt with her laptop

## 2024-03-03 NOTE — Progress Notes (Signed)
 Pt dressed herself. Pt's spouse with 3 bags of pt belongings taken to the vehicle. Pt with her eyeglasses, case, and cell phone. No distress noted with pt. Pt voices understanding to dc/rx instructions. Pt advised to return to any ED as needed/worse. Pt voices understanding.

## 2024-03-03 NOTE — Discharge Summary (Signed)
 Physician Discharge Summary   Patient: Lisa Mccullough MRN: [id number] DOB: [birth year]  Admit date:     03/01/2024  Discharge date: 03/03/24  Discharge Physician: Amaryllis Dare   PCP: Verdon Keen, MD   Recommendations at discharge:  Please obtain CBC and BMP and follow-up Please arrange a CT chest in 3 to 6 months for pulmonary nodule follow-up Follow-up with primary care provider Follow-up with general surgery  Discharge Diagnoses: Principal Problem:   Pneumothorax on right Active Problems:   Lower back pain   Right shoulder pain   Lung nodule   HLD (hyperlipidemia)   Depression with anxiety   Obesity (BMI 30-39.9)   Hospital Course: Taken from H&P.  Lisa Mccullough is a 63 y.o. female with medical history significant of hyperlipidemia, depression with anxiety, obesity, who presents with chest pain and SOB.   Patient [place] 10 stairs on 7/4.  No SOB. Patient strongly denies head injury and does not want to do CT scan of the head. She injured her lower back and right shoulder, causing pain in lower back and right shoulder. She went to have acupuncture dry needling done to the right upper torso at about 8:00 AM.  At about 11 AM, she gradually started having SOB and right-sided chest pain.   Patient was found to have large right pneumothorax by chest x-ray.  ED physician tried to place a chest tube without success, then consulted Dr. Jordis of surgery, who successfully placed the chest tube for patient.   Otherwise vitals and labs stable.  Chest x-ray with large right-sided pneumothorax, no mediastinal shift.  CT chest with large right sided pneumothorax, also found to have a right lung pulmonary nodule which will need a repeat CT in 3 to 63-month.  Repeat chest x-ray after chest tube placement with interval reexpansion of right lung and a small right apical pneumothorax persist.  7/16: Vital labs stable.  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  X-ray of lumbar spine was also negative for any acute abnormality, multilevel degenerative disc disease. Repeat chest x-ray this morning with improving pneumothorax, only tiny residual pneumothorax noted.  7/17: Patient remained hemodynamically stable.  Chest tube was removed and no recurrence of pneumothorax on repeat chest x-ray.  Patient will continue on current medications and need to have a close follow-up with her providers for further assistance.  She will also need a repeat CT chest for pulmonary nodule follow-up.  Assessment and Plan: * Pneumothorax on right Large pneumothorax on admission s/p chest tube placement. Chest tube was removed on 7/17 with a stable post removal chest x-ray. - Supportive care  Lower back pain Right shoulder pain. Pain seems improving.  Imaging negative for any acute abnormality, only shows degenerative changes. -Continue with supportive care and pain management  Lung nodule Incidental finding on CT chest. -Follow-up with PCP as outpatient to repeat CT scan in 3 to 6 months.   HLD (hyperlipidemia) Patient is not taking any medications at this time. - Out patient follow-up by PCP  Depression with anxiety - Continue home bupropion   Obesity (BMI 30-39.9) Estimated body mass index is 30.23 kg/m as calculated from the following:   Height as of this encounter: 5' 1 (1.549 m).   Weight as of this encounter: 72.6 kg.   Class I obesity. Encouraged weight loss.  Consultants: Surgery Procedures performed: Chest tube placement Disposition: Home Diet recommendation:  Discharge  Diet Orders (From admission, onward)     Start     Ordered   03/03/24 0000  Diet - low sodium heart healthy        03/03/24 1635           Cardiac diet DISCHARGE MEDICATION: Allergies as of 03/03/2024       Reactions   Codeine Sulfate Other (See Comments)        Medication List      STOP taking these medications    diclofenac  75 MG EC tablet Commonly known as: VOLTAREN    fluconazole  150 MG tablet Commonly known as: DIFLUCAN    ibuprofen  800 MG tablet Commonly known as: ADVIL    Maxitrol 0.1 % ophthalmic suspension Generic drug: neomycin-polymyxin-dexamethasone   methocarbamol  500 MG tablet Commonly known as: Robaxin    methylPREDNISolone  4 MG Tbpk tablet Commonly known as: MEDROL  DOSEPAK   oxyCODONE -acetaminophen  5-325 MG tablet Commonly known as: Percocet       TAKE these medications    bacitracin ophthalmic ointment Use small amount on sutures 4 times a day for 12-14 days.  Discontinue if allergy symptoms develop and call our office.   buPROPion  300 MG 24 hr tablet Commonly known as: WELLBUTRIN  XL Take 300 mg by mouth daily.   D 1000 25 MCG (1000 UT) capsule Generic drug: Cholecalciferol  Take 1,000 Units by mouth daily.   fluticasone  50 MCG/ACT nasal spray Commonly known as: FLONASE  USE 2 SPRAYS IN EACH NOSTRIL ONCE DAILY   lidocaine  5 % Commonly known as: LIDODERM  Place 1 patch onto the skin daily. Remove & Discard patch within 12 hours or as directed by MD   [place] 1.338 GM/ML Soln Generic drug: Perfluorohexyloctane   naproxen  500 MG tablet Commonly known as: Naprosyn  Take 1 tablet (500 mg total) by mouth 2 (two) times daily with a meal.   pregabalin  100 MG capsule Commonly known as: LYRICA  Take 1 capsule (100 mg total) by mouth 2 (two) times daily.   Premarin  0.625 MG tablet Generic drug: estrogens  (conjugated) TAKE 1 TABLET (0.625 MG TOTAL) BY MOUTH ONCE DAILY.        Follow-up Information     Little Sturgeon, [place], MD Follow up on 03/14/2024.   Specialty: General Surgery Contact information: [address] [place] [address] [phone]         Verdon Keen, MD. Schedule an appointment as soon as possible for a visit in 1 week(s).   Specialty: Obstetrics and Gynecology Contact  information: [address] [place] [address] [phone]                Discharge Exam: Lisa Mccullough   03/01/24 1744  Weight: 72.6 kg   General.  Well-developed lady, in no acute distress. Pulmonary.  Lungs clear bilaterally, normal respiratory effort. CV.  Regular rate and rhythm, no JVD, rub or murmur. Abdomen.  Soft, nontender, nondistended, BS positive. CNS.  Alert and oriented .  No focal neurologic deficit. Extremities.  No edema, no cyanosis, pulses intact and symmetrical. Psychiatry.  Judgment and insight appears normal.   Condition at discharge: stable  The results of significant diagnostics from this hospitalization (including imaging, microbiology, ancillary and laboratory) are listed below for reference.   Imaging Studies: DG Chest 2 View Result Date: 03/03/2024 CLINICAL DATA:  Pneumothorax, post chest tube removal EXAM: CHEST - 2 VIEW COMPARISON:  Earlier film of the same day FINDINGS: Interval removal of right chest tube. No pneumothorax. Lungs clear. Heart size and mediastinal contours are  within normal limits. No effusion. Visualized bones unremarkable. IMPRESSION: Interval right chest tube removal without pneumothorax. Electronically Signed   By: JONETTA Faes M.D.   On: 03/03/2024 16:32   DG CHEST PORT 1 VIEW Result Date: 03/03/2024 CLINICAL DATA:  Pneumothorax. EXAM: PORTABLE CHEST 1 VIEW COMPARISON:  03/02/2024 FINDINGS: Right sided pigtail thoracostomy tube overlies the periphery of the right upper lobe. No visible pneumothorax identified at this time. Heart size and mediastinal contours are unremarkable. There is no pleural effusion or interstitial edema. No airspace opacities identified. Visualized osseous structures are unremarkable. IMPRESSION: Right-sided pigtail thoracostomy tube overlies the periphery of the right upper lobe. No visible pneumothorax identified at this time. Electronically Signed   By: Waddell Calk M.D.   On: 03/03/2024 07:16    DG Shoulder Right Result Date: 03/02/2024 CLINICAL DATA:  Shoulder pain which started at 11 a.m. EXAM: RIGHT SHOULDER - 2+ VIEW COMPARISON:  None Available. FINDINGS: Right-sided pigtail thoracostomy tube is again noted overlying the right mid lung. No signs of right shoulder fracture or dislocation. Spurring at the acromioclavicular joint noted. No other significant bone abnormality. IMPRESSION: 1. No acute findings. 2. Right acromioclavicular osteoarthritis. Electronically Signed   By: Waddell Calk M.D.   On: 03/02/2024 07:25   DG Lumbar Spine 2-3 Views Result Date: 03/02/2024 CLINICAL DATA:  Back pain EXAM: LUMBAR SPINE - 2-3 VIEW COMPARISON:  None Available. FINDINGS: There is mild stepwise retrolisthesis L1 on L2, L2 on L3 and L3 on L4. The alignment is otherwise normal. The lumbar vertebral body heights are well preserved. Multilevel disc space narrowing and endplate spurring is most severe at the L1-2 level. No signs of acute fracture or subluxation. IMPRESSION: 1. No acute findings. 2. Multilevel degenerative disc disease. Electronically Signed   By: Waddell Calk M.D.   On: 03/02/2024 07:23   DG CHEST PORT 1 VIEW Result Date: 03/02/2024 CLINICAL DATA:  Evaluate pneumothorax. Reports chest pain and shortness of breath. EXAM: PORTABLE CHEST 1 VIEW COMPARISON:  03/01/2024 FINDINGS: Right-sided pigtail thoracostomy tube overlies the periphery of the right mid lung. Barely visible tiny right apical pneumothorax appears decreased in volume from the prior exam. No pleural effusion, interstitial edema or airspace disease. Mild atelectasis within the lung bases as before. IMPRESSION: 1. Right-sided pigtail thoracostomy tube in place. 2. Tiny right apical pneumothorax appears decreased in volume from the prior exam. Electronically Signed   By: Waddell Calk M.D.   On: 03/02/2024 07:20   DG CHEST PORT 1 VIEW Result Date: 03/01/2024 CLINICAL DATA:  Chest tube EXAM: PORTABLE CHEST 1 VIEW COMPARISON:   Chest x-ray 03/01/2024 FINDINGS: There is a new right-sided chest tube in place with mild right chest wall emphysema. There is been interval re-expansion of the right lung. Small right apical pneumothorax persists measuring 6 mm at the lung apex. There is minimal atelectasis in the lung bases. Cardiomediastinal silhouette is within normal limits. No acute fractures are seen. IMPRESSION: New right-sided chest tube in place with interval re-expansion of the right lung. Small right apical pneumothorax persists measuring 6 mm at the lung apex. Electronically Signed   By: Greig Pique M.D.   On: 03/01/2024 23:08   CT CHEST WO CONTRAST Result Date: 03/01/2024 CLINICAL DATA:  Shortness of breath and chest pain.  Pneumothorax. EXAM: CT CHEST WITHOUT CONTRAST TECHNIQUE: Multidetector CT imaging of the chest was performed following the standard protocol without IV contrast. RADIATION DOSE REDUCTION: This exam was performed according to the departmental dose-optimization program which includes  automated exposure control, adjustment of the mA and/or kV according to patient size and/or use of iterative reconstruction technique. COMPARISON:  None Available. FINDINGS: Cardiovascular: No significant vascular findings. Normal heart size. No pericardial effusion. Mediastinum/Nodes: No enlarged mediastinal or axillary lymph nodes. Thyroid gland, trachea, and esophagus demonstrate no significant findings. Lungs/Pleura: There is a large right-sided pneumothorax. There is no mediastinal shift. There is atelectasis in the upper lobe, right middle lobe and right lower lobe. There is a right lower lobe pulmonary nodule measuring 6 mm. There is a 3 mm right upper lobe nodule image 4/39. There is scarring in the left lung apex. Left lung is otherwise clear. Trachea and central airways are patent. Upper Abdomen: No acute abnormality. Musculoskeletal: No fracture is seen. IMPRESSION: 1. Large right-sided pneumothorax. No mediastinal shift.  2. Right lung pulmonary nodules measuring up to 6 mm. Non-contrast chest CT at 3-6 months is recommended. If the nodules are stable at time of repeat CT, then future CT at 18-24 months (from today's scan) is considered optional for low-risk patients, but is recommended for high-risk patients. This recommendation follows the consensus statement: Guidelines for Management of Incidental Pulmonary Nodules Detected on CT Images: From the Fleischner Society 2017; Radiology 2017; 284:228-243. Electronically Signed   By: Greig Pique M.D.   On: 03/01/2024 21:23   DG Chest Port 1 View Result Date: 03/01/2024 CLINICAL DATA:  Pneumothorax EXAM: PORTABLE CHEST 1 VIEW COMPARISON:  Chest x-ray 03/02/2023 FINDINGS: Large right pneumothorax appears unchanged from prior. There is no mediastinal shift. There is right lower lobe atelectasis. Left lung is clear. Cardiomediastinal silhouette is within normal limits. No acute fractures are identified. IMPRESSION: Unchanged large right pneumothorax. No mediastinal shift. Electronically Signed   By: Greig Pique M.D.   On: 03/01/2024 20:46   DG Chest 2 View Result Date: 03/01/2024 CLINICAL DATA:  cp and dyspnea. Recent dry needling done for back and shoulder. EXAM: CHEST - 2 VIEW COMPARISON:  None available. FINDINGS: Large right pneumothorax. Patchy opacities in the right mid and lower lung zones, likely atelectasis. No pleural effusion. No mediastinal shift. No cardiomegaly. No acute fracture or destructive lesion. IMPRESSION: Large right pneumothorax.  No mediastinal shift. Critical Value/emergent results were called by telephone at the time of interpretation on 03/01/2024 at 6:15 pm to provider ARTIST KERNS MD , who verbally acknowledged these results. Electronically Signed   By: Rogelia Myers M.D.   On: 03/01/2024 18:19    Microbiology: Results for orders placed or performed during the hospital encounter of 03/28/15  Eye culture     Status: None   Collection Time:  03/28/15  9:15 AM   Specimen: Eye  Result Value Ref Range Status   Specimen Description EYE dacryocystitis right lower puncture  Final   Special Requests NONE  Final   Culture NO GROWTH 3 DAYS  Final   Report Status 03/31/2015 FINAL  Final    Labs: CBC: Recent Labs  Lab 03/01/24 1745 03/02/24 0602  WBC 6.8 7.2  HGB 12.7 13.1  HCT 38.4 39.9  MCV 98.2 100.5*  PLT 231 221   Basic Metabolic Panel: Recent Labs  Lab 03/01/24 1745  NA 141  K 3.7  CL 104  CO2 25  GLUCOSE 95  BUN 10  CREATININE 0.79  CALCIUM 9.2   Liver Function Tests: No results for input(s): AST, ALT, ALKPHOS, BILITOT, PROT, ALBUMIN in the last 168 hours. CBG: No results for input(s): GLUCAP in the last 168 hours.  Discharge time spent:  greater than 30 minutes.  This record has been created using Conservation officer, historic buildings. Errors have been sought and corrected,but may not always be located. Such creation errors do not reflect on the standard of care.   Signed: Amaryllis Dare, MD [facility] 03/03/2024

## 2024-03-14 ENCOUNTER — Ambulatory Visit: Admitting: Surgery

## 2024-03-14 ENCOUNTER — Encounter: Payer: Self-pay | Admitting: Surgery

## 2024-03-14 VITALS — BP 103/65 | HR 77 | Ht 61.0 in | Wt 160.0 lb

## 2024-03-14 DIAGNOSIS — J939 Pneumothorax, unspecified: Secondary | ICD-10-CM

## 2024-03-14 NOTE — Patient Instructions (Signed)
 Please let us  know if you start to develop any symptoms and we will see you.    Follow-up with our office as needed.  Please call and ask to speak with a nurse if you develop questions or concerns.

## 2024-03-15 NOTE — Progress Notes (Signed)
 Outpatient Surgical Follow Up    Lisa Mccullough is an 63 y.o. female.   Chief Complaint  Patient presents with   Hospitalization Follow-up    Pneumothorax     HPI: Lisa Mccullough is following up after right sided pneumothorax that was being able to be successfully managed with chest tube.  She is doing well.  No shortness of breath no chest pain.  She is back to work.  No fevers no chills.  I did personally reviewed her studies including a CT and latest chest x-ray without evidence of pneumothorax.  Mild right-sided chest pain that is mild intermittent and worsening with certain movements.  Past Medical History:  Diagnosis Date   Depression with anxiety    HLD (hyperlipidemia)     Past Surgical History:  Procedure Laterality Date   ABDOMINAL HYSTERECTOMY     CHEST TUBE INSERTION      Family History  Problem Relation Age of Onset   Breast cancer Neg Hx     Social History:  reports that she has never smoked. She has never been exposed to tobacco smoke. She has never used smokeless tobacco. She reports that she does not drink alcohol and does not use drugs.  Allergies:  Allergies  Allergen Reactions   Codeine Sulfate Other (See Comments)    Medications reviewed.    ROS Full ROS performed and is otherwise negative other than what is stated in HPI   BP 103/65   Pulse 77   Ht 5' 1 (1.549 m)   Wt 160 lb (72.6 kg)   SpO2 98%   BMI 30.23 kg/m   Physical Exam Vitals and nursing note reviewed. Exam conducted with a chaperone present.  Constitutional:      General: She is not in acute distress.    Appearance: Normal appearance. She is not ill-appearing.  Cardiovascular:     Rate and Rhythm: Normal rate and regular rhythm.     Heart sounds: No murmur heard.    No friction rub.  Pulmonary:     Effort: Pulmonary effort is normal. No respiratory distress.     Breath sounds: Normal breath sounds. No stridor. No wheezing or rhonchi.     Comments: CT site healing well, no  subcutaneous air Abdominal:     General: Abdomen is flat. There is no distension.     Palpations: There is no mass.     Tenderness: There is no abdominal tenderness. There is no guarding or rebound.     Hernia: No hernia is present.  Musculoskeletal:        General: No swelling or tenderness. Normal range of motion.     Cervical back: Normal range of motion and neck supple. No rigidity or tenderness.  Skin:    General: Skin is warm and dry.     Capillary Refill: Capillary refill takes less than 2 seconds.  Neurological:     General: No focal deficit present.     Mental Status: She is alert and oriented to person, place, and time.  Psychiatric:        Mood and Affect: Mood normal.        Behavior: Behavior normal.        Thought Content: Thought content normal.     Assessment/Plan: Right pneumothorax responded to chest tube management.  Currently no evidence of clinical pneumothorax.  No evidence of complication.  Do not think that she needs a chest x-ray.  Discussed with her in detail about precautions.  No  need for surgical intervention.  I personally spent a total of 30 minutes in the care of the patient today including performing a medically appropriate exam/evaluation, counseling and educating, placing orders, referring and communicating with other health care professionals, documenting clinical information in the EHR, independently interpreting and reviewing images studies and coordinating care.   Laneta Luna, MD [facility] General Surgeon

## 2024-04-25 ENCOUNTER — Other Ambulatory Visit: Payer: Self-pay | Admitting: Internal Medicine

## 2024-04-25 DIAGNOSIS — Z1231 Encounter for screening mammogram for malignant neoplasm of breast: Secondary | ICD-10-CM

## 2024-07-27 ENCOUNTER — Ambulatory Visit
Admission: RE | Admit: 2024-07-27 | Discharge: 2024-07-27 | Disposition: A | Discharge status: Home / Self Care | Source: Ambulatory Visit | Attending: Internal Medicine | Admitting: Internal Medicine

## 2024-07-27 DIAGNOSIS — Z1231 Encounter for screening mammogram for malignant neoplasm of breast: Secondary | ICD-10-CM | POA: Diagnosis present

## 2024-10-14 ENCOUNTER — Ambulatory Visit: Admit: 2024-10-14
